# Patient Record
Sex: Female | Born: 1955 | Race: White | Hispanic: No | Smoking: Former smoker
Health system: Southern US, Community
[De-identification: ages and names within clinical notes are randomized; demographics above are authoritative.]

## PROBLEM LIST (undated history)

## (undated) DIAGNOSIS — R32 Unspecified urinary incontinence: Secondary | ICD-10-CM

## (undated) DIAGNOSIS — G4733 Obstructive sleep apnea (adult) (pediatric): Secondary | ICD-10-CM

## (undated) DIAGNOSIS — F32A Depression, unspecified: Secondary | ICD-10-CM

## (undated) DIAGNOSIS — H409 Unspecified glaucoma: Secondary | ICD-10-CM

## (undated) DIAGNOSIS — J45909 Unspecified asthma, uncomplicated: Secondary | ICD-10-CM

## (undated) DIAGNOSIS — M1990 Unspecified osteoarthritis, unspecified site: Secondary | ICD-10-CM

## (undated) DIAGNOSIS — E0590 Thyrotoxicosis, unspecified without thyrotoxic crisis or storm: Secondary | ICD-10-CM

## (undated) DIAGNOSIS — Z8679 Personal history of other diseases of the circulatory system: Secondary | ICD-10-CM

## (undated) DIAGNOSIS — H548 Legal blindness, as defined in USA: Secondary | ICD-10-CM

## (undated) DIAGNOSIS — J449 Chronic obstructive pulmonary disease, unspecified: Secondary | ICD-10-CM

## (undated) DIAGNOSIS — Z8619 Personal history of other infectious and parasitic diseases: Secondary | ICD-10-CM

## (undated) DIAGNOSIS — Z8669 Personal history of other diseases of the nervous system and sense organs: Secondary | ICD-10-CM

## (undated) DIAGNOSIS — K219 Gastro-esophageal reflux disease without esophagitis: Secondary | ICD-10-CM

## (undated) DIAGNOSIS — T7840XA Allergy, unspecified, initial encounter: Secondary | ICD-10-CM

## (undated) DIAGNOSIS — F329 Major depressive disorder, single episode, unspecified: Secondary | ICD-10-CM

## (undated) HISTORY — PX: CARPAL TUNNEL RELEASE: SHX101

## (undated) HISTORY — DX: Legal blindness, as defined in USA: H54.8

## (undated) HISTORY — PX: BREAST REDUCTION SURGERY: SHX8

## (undated) HISTORY — DX: Obstructive sleep apnea (adult) (pediatric): G47.33

## (undated) HISTORY — DX: Depression, unspecified: F32.A

## (undated) HISTORY — DX: Allergy, unspecified, initial encounter: T78.40XA

## (undated) HISTORY — DX: Unspecified osteoarthritis, unspecified site: M19.90

## (undated) HISTORY — DX: Gastro-esophageal reflux disease without esophagitis: K21.9

## (undated) HISTORY — DX: Unspecified glaucoma: H40.9

## (undated) HISTORY — DX: Unspecified urinary incontinence: R32

## (undated) HISTORY — DX: Personal history of other diseases of the circulatory system: Z86.79

## (undated) HISTORY — DX: Unspecified asthma, uncomplicated: J45.909

## (undated) HISTORY — PX: CHOLECYSTECTOMY: SHX55

## (undated) HISTORY — DX: Thyrotoxicosis, unspecified without thyrotoxic crisis or storm: E05.90

## (undated) HISTORY — DX: Personal history of other diseases of the nervous system and sense organs: Z86.69

## (undated) HISTORY — DX: Chronic obstructive pulmonary disease, unspecified: J44.9

## (undated) HISTORY — DX: Major depressive disorder, single episode, unspecified: F32.9

## (undated) HISTORY — DX: Personal history of other infectious and parasitic diseases: Z86.19

---

## 1981-07-09 HISTORY — PX: TUBAL LIGATION: SHX77

## 1998-07-09 HISTORY — PX: ROTATOR CUFF REPAIR: SHX139

## 2009-07-09 HISTORY — PX: BARIATRIC SURGERY: SHX1103

## 2009-12-18 ENCOUNTER — Emergency Department (HOSPITAL_BASED_OUTPATIENT_CLINIC_OR_DEPARTMENT_OTHER): Admission: EM | Admit: 2009-12-18 | Discharge: 2009-12-18 | Payer: Self-pay | Admitting: Emergency Medicine

## 2009-12-18 ENCOUNTER — Ambulatory Visit: Payer: Self-pay | Admitting: Diagnostic Radiology

## 2010-07-09 HISTORY — PX: JOINT REPLACEMENT: SHX530

## 2012-07-23 ENCOUNTER — Ambulatory Visit (INDEPENDENT_AMBULATORY_CARE_PROVIDER_SITE_OTHER): Payer: BC Managed Care – PPO | Admitting: Family

## 2012-07-23 ENCOUNTER — Ambulatory Visit (HOSPITAL_BASED_OUTPATIENT_CLINIC_OR_DEPARTMENT_OTHER)
Admission: RE | Admit: 2012-07-23 | Discharge: 2012-07-23 | Disposition: A | Discharge status: Home / Self Care | Payer: BC Managed Care – PPO | Source: Ambulatory Visit | Attending: Family | Admitting: Family

## 2012-07-23 ENCOUNTER — Encounter: Payer: Self-pay | Admitting: Family

## 2012-07-23 VITALS — BP 124/94 | HR 63 | Temp 97.9°F | Resp 16 | Ht 66.5 in | Wt 211.1 lb

## 2012-07-23 DIAGNOSIS — F418 Other specified anxiety disorders: Secondary | ICD-10-CM

## 2012-07-23 DIAGNOSIS — E049 Nontoxic goiter, unspecified: Secondary | ICD-10-CM

## 2012-07-23 DIAGNOSIS — J4489 Other specified chronic obstructive pulmonary disease: Secondary | ICD-10-CM

## 2012-07-23 DIAGNOSIS — M1990 Unspecified osteoarthritis, unspecified site: Secondary | ICD-10-CM

## 2012-07-23 DIAGNOSIS — Z5181 Encounter for therapeutic drug level monitoring: Secondary | ICD-10-CM

## 2012-07-23 DIAGNOSIS — H409 Unspecified glaucoma: Secondary | ICD-10-CM

## 2012-07-23 DIAGNOSIS — F341 Dysthymic disorder: Secondary | ICD-10-CM

## 2012-07-23 DIAGNOSIS — K219 Gastro-esophageal reflux disease without esophagitis: Secondary | ICD-10-CM | POA: Insufficient documentation

## 2012-07-23 DIAGNOSIS — E042 Nontoxic multinodular goiter: Secondary | ICD-10-CM | POA: Insufficient documentation

## 2012-07-23 DIAGNOSIS — E0590 Thyrotoxicosis, unspecified without thyrotoxic crisis or storm: Secondary | ICD-10-CM

## 2012-07-23 DIAGNOSIS — J449 Chronic obstructive pulmonary disease, unspecified: Secondary | ICD-10-CM | POA: Insufficient documentation

## 2012-07-23 DIAGNOSIS — N393 Stress incontinence (female) (male): Secondary | ICD-10-CM

## 2012-07-23 DIAGNOSIS — J309 Allergic rhinitis, unspecified: Secondary | ICD-10-CM

## 2012-07-23 LAB — BASIC METABOLIC PANEL WITH GFR
Calcium: 9.8 mg/dL (ref 8.4–10.5)
Chloride: 106 mEq/L (ref 96–112)
GFR, Est Non African American: 69 mL/min
Glucose, Bld: 83 mg/dL (ref 70–99)

## 2012-07-23 LAB — HEPATIC FUNCTION PANEL
ALT: <8 U/L (ref 0–35)
AST: 15 U/L (ref 0–37)
Bilirubin, Direct: 0.2 mg/dL (ref 0.0–0.3)
Indirect Bilirubin: 0.8 mg/dL (ref 0.0–0.9)

## 2012-07-23 LAB — T3, FREE: T3, Free: 3.4 pg/mL (ref 2.3–4.2)

## 2012-07-23 MED ORDER — ALBUTEROL SULFATE HFA 108 (90 BASE) MCG/ACT IN AERS: 2.0000 | INHALATION_SPRAY | RESPIRATORY_TRACT | Status: DC | PRN

## 2012-07-23 MED ORDER — VENLAFAXINE HCL ER 75 MG PO CP24: 75.0000 mg | ORAL_CAPSULE | Freq: Every day | ORAL | Status: DC

## 2012-07-23 MED ORDER — BUDESONIDE-FORMOTEROL FUMARATE 160-4.5 MCG/ACT IN AERO: 1.0000 | INHALATION_SPRAY | Freq: Every day | RESPIRATORY_TRACT | Status: DC

## 2012-07-23 MED ORDER — IBUPROFEN 800 MG PO TABS: 800.0000 mg | ORAL_TABLET | Freq: Three times a day (TID) | ORAL | Status: DC | PRN

## 2012-07-23 MED ORDER — METHIMAZOLE 10 MG PO TABS: 10.0000 mg | ORAL_TABLET | Freq: Every day | ORAL | Status: DC

## 2012-07-23 MED ORDER — LAMOTRIGINE 50 MG PO TBDP: 50.0000 mg | ORAL_TABLET | Freq: Two times a day (BID) | ORAL | Status: DC

## 2012-07-23 NOTE — Assessment & Plan Note (Signed)
Not ready to quit smoking. 

## 2012-07-23 NOTE — Assessment & Plan Note (Addendum)
Deteriorated as pt has been unable to afford her effexor.  She is waiting for her flex card.  Plan resume effexor as soon as possible.

## 2012-07-23 NOTE — Assessment & Plan Note (Signed)
Stable off meds since weight loss.  Monitor.

## 2012-07-23 NOTE — Assessment & Plan Note (Signed)
Goiter noted on exam today. On tapazole.  Obtain thyroid US, TFT's.

## 2012-07-23 NOTE — Assessment & Plan Note (Signed)
Continue lumigan eye drops.

## 2012-07-23 NOTE — Assessment & Plan Note (Signed)
Resume flonase.

## 2012-07-23 NOTE — Patient Instructions (Addendum)
  You will be contact about your referral to Pulmonology.  Please let us know if you have not heard back within 1 week about your referral. Please follow up in 1 month for a fasting physical- we will arrange for you. Welcome to [facility]!

## 2012-07-23 NOTE — Progress Notes (Signed)
  Subjective:    Patient ID: Audrey Lewis, female    DOB: [birth year], 56 y.o.   MRN: [id number]  HPI  Ms.  Traxler is a 57 yr old female who presents today to establish care. She has followed with Dr. Debroah Loop in [place].   Asthma/COPD- currently on symbicort and albuterol. Generally well controlled unless she gets sick with "bronchitis." She would like referral to pulmonology.  Depression/anxiety-currently on effexor/xanax and lamictal.  Reports hx of severe depression. Has seen psych and counselor. Has been on antidepressants x >20 yrs.  She has been out of effexor x 2 weeks.  Waiting on flex pay card so she can get her medication.   GERD- Not currently on PPI.  Reports that this has been improved since her bariatric procedure.    Glaucoma/legally blind- using lumigan drops.  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    Seasonal allergies- "lot of sinus issues." seems to be worse in the fall and winter.  Uses otc anti-histamines.  She has flonase at home.  Will restart.    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  Declines referral to ortho at this time due to work restraints.    Obesity- s/p bariatric surgery 2011.  Gastric bypass.  Performed at [facility]- Dr. Adolphus Birchwood. Max weight was 335 pounds.  OSA- has not had CPAP in 2 years.  Has lost >100 pounds. Still snores.    Urinary incontinence- Has been a problem for "years."  Has been to urology- improved since weight loss.  Wears a pad "all the time." + stress incontinence.  Hyperthyroid- maintained on tapazole. She was diagnosed 3 yrs ago.  She was following with her primary care.    Review of Systems  Constitutional: Negative for unexpected weight change.  HENT: Negative for hearing loss.   Eyes: Positive for visual disturbance.  Respiratory: Positive for cough.   Cardiovascular: Negative for leg swelling.  Gastrointestinal: Negative for nausea, vomiting, diarrhea and  constipation.  Genitourinary: Negative for dysuria.  Musculoskeletal: Positive for arthralgias.  Skin: Negative for rash.  Neurological:       Reports rare headaches  Hematological: Negative for adenopathy.  Psychiatric/Behavioral:       See HPI        Objective:   Physical Exam  Constitutional: She is oriented to person, place, and time. She appears well-developed and well-nourished.       Overweight white femal.   HENT:  Head: Normocephalic and atraumatic.  Mouth/Throat: No oropharyngeal exudate.  Eyes: No scleral icterus.  Neck: Thyromegaly present.       Right thyroid lobe is larger than left  Cardiovascular: Normal rate and regular rhythm.   No murmur heard. Pulmonary/Chest: Effort normal and breath sounds normal. No respiratory distress. She has no wheezes. She has no rales. She exhibits no tenderness.  Musculoskeletal: She exhibits no edema.  Neurological: She is alert and oriented to person, place, and time.  Skin:       Hair is thinning  Psychiatric: Her speech is normal and behavior is normal. Judgment and thought content normal. Cognition and memory are normal.       Briefly tearful today- "because I am out of effexor"          Assessment & Plan:

## 2012-07-23 NOTE — Assessment & Plan Note (Signed)
Declines referral to ortho at this time as she does not have enough time off from work.  Would like something to help with pain. Has tried ibuprofen 800 in the past which helped.  Refill provided today.  Advised pt to use sparingly and use tylenol mostly.

## 2012-07-25 ENCOUNTER — Ambulatory Visit (INDEPENDENT_AMBULATORY_CARE_PROVIDER_SITE_OTHER): Payer: BC Managed Care – PPO | Admitting: Family

## 2012-07-25 ENCOUNTER — Encounter: Payer: Self-pay | Admitting: Family

## 2012-07-25 VITALS — BP 114/82 | HR 63 | Temp 97.8°F | Resp 18 | Ht 66.5 in

## 2012-07-25 DIAGNOSIS — R05 Cough: Secondary | ICD-10-CM

## 2012-07-25 DIAGNOSIS — J069 Acute upper respiratory infection, unspecified: Secondary | ICD-10-CM

## 2012-07-25 DIAGNOSIS — R059 Cough, unspecified: Secondary | ICD-10-CM

## 2012-07-25 DIAGNOSIS — J449 Chronic obstructive pulmonary disease, unspecified: Secondary | ICD-10-CM

## 2012-07-25 DIAGNOSIS — J4489 Other specified chronic obstructive pulmonary disease: Secondary | ICD-10-CM

## 2012-07-25 DIAGNOSIS — E042 Nontoxic multinodular goiter: Secondary | ICD-10-CM

## 2012-07-25 MED ORDER — PREDNISONE 10 MG PO TABS: ORAL_TABLET | ORAL | Status: DC

## 2012-07-25 MED ORDER — ALBUTEROL SULFATE (2.5 MG/3ML) 0.083% IN NEBU
2.5000 mg | INHALATION_SOLUTION | Freq: Once | RESPIRATORY_TRACT | Status: AC
  Administered 2012-07-25: 2.5 mg via RESPIRATORY_TRACT

## 2012-07-25 MED ORDER — ALBUTEROL SULFATE (2.5 MG/3ML) 0.083% IN NEBU
2.5000 mg | INHALATION_SOLUTION | Freq: Four times a day (QID) | RESPIRATORY_TRACT | Status: AC | PRN
Start: 2012-07-25 — End: ?

## 2012-07-25 NOTE — Patient Instructions (Addendum)
Please start prednisone, use albuterol every 6 hours. You may use delsym as needed for cough. You will be contact about your referral to endocrinology.  Please let us know if you have not heard back within 1 week about your referral. Call if symptoms worsen, or if not improved by Monday.

## 2012-07-25 NOTE — Assessment & Plan Note (Signed)
Suspect early COPD exacerbation.  Will rx pred taper along with albuterol nebs- rx provided for nebulizer with tubing.

## 2012-07-25 NOTE — Progress Notes (Signed)
Subjective:    Patient ID: Audrey Lewis, female    DOB: 08/06/1955, 57 y.o.   MRN: [id number]  HPI  Pt reports cough and post nasal drip since yesterday. Has headache and throat feels raw. Pt reports completing 2 round antibiotics and medrol dose packs since 06/24/13 for bronchitis. Pt reports that she has a Proofreader" who she saw yesterday due to cough and chest tightness.  Pt was told that she was having trouble moving some air and was given nebulizer.   She reports brief improvement after nebulizer.    Goiter-  [place] notes multinodular goiter.  TSH was elevated.  Pt is on tapazole.   Review of Systems See HPI  Past Medical History  Diagnosis Date  . Arthritis   . Asthma   . Depression   . History of chicken pox   . COPD (chronic obstructive pulmonary disease)   . GERD (gastroesophageal reflux disease)   . Glaucoma   . Allergy   . Urinary incontinence   . History of high blood pressure   . Thyroid disease     overactive  . H/O sleep apnea   . Legally blind     History   Social History  . Marital Status: Divorced    Spouse Name: N/A    Number of Children: 1  . Years of Education: N/A   Occupational History  .  [employer]   Social History Main Topics  . Smoking status: Current Every Day Smoker -- 2.0 packs/day    Types: Cigarettes  . Smokeless tobacco: Never Used  . Alcohol Use: No  . Drug Use: Not on file  . Sexually Active: Not on file   Other Topics Concern  . Not on file   Social History Narrative   Works as a Producer, television/film/video is single- lives aloneDaughter lives in [place]Completed 12th gradeEnjoys bowling, blind/visually impaired friends, gambling- internet slots.      Past Surgical History  Procedure Date  . Carpal tunnel release     bilateral  . Rotator cuff repair 2000    right shoulder  . Bariatric surgery 2011  . Joint replacement 2012    left knee  . Tubal ligation 1983    Family History  Problem Relation Age of Onset    . Arthritis Mother   . Hypertension Mother   . Arthritis Father   . Cancer Father     prostate  . Hyperlipidemia Father   . Heart disease Father   . Diabetes Maternal Aunt   . Diabetes Maternal Grandmother   . Hypertension Maternal Grandmother   . Heart disease Maternal Grandfather   . Heart disease Paternal Grandfather     Allergies  Allergen Reactions  . Shellfish Allergy Shortness Of Breath and Swelling    Current Outpatient Prescriptions on File Prior to Visit  Medication Sig Dispense Refill  . albuterol (PROAIR HFA) 108 (90 BASE) MCG/ACT inhaler Inhale 2 puffs into the lungs every 4 (four) hours as needed.  3 Inhaler  0  . ALPRAZolam (XANAX) 0.5 MG tablet Take 0.5 mg by mouth 2 (two) times daily as needed.      . bimatoprost (LUMIGAN) 0.01 % SOLN Place 1 drop into both eyes every evening.      . budesonide-formoterol (SYMBICORT) 160-4.5 MCG/ACT inhaler Inhale 1 puff into the lungs daily.  3 Inhaler  0  . ibuprofen (ADVIL,MOTRIN) 800 MG tablet Take 1 tablet (800 mg total) by mouth every 8 (eight) hours as  needed for pain.  30 tablet  0  . LamoTRIgine 50 MG TBDP Take 1 tablet (50 mg total) by mouth 2 (two) times daily.  180 tablet  0  . Multiple Vitamins-Minerals (ONE DAILY WOMENS PO) Take 1 tablet by mouth daily.      . NON FORMULARY ColonClenz.  Take 2 capsules twice a day.      . venlafaxine XR (EFFEXOR-XR) 75 MG 24 hr capsule Take 1 capsule (75 mg total) by mouth daily.  90 capsule  0    BP 114/82  Pulse 63  Temp 97.8 F (36.6 C) (Oral)  Resp 18  Ht 5' 6.5" (1.689 m)  SpO2 97%  LMP 07/09/2008       Objective:   Physical Exam  Constitutional: She appears well-developed and well-nourished. No distress.  HENT:  Head: Normocephalic and atraumatic.  Right Ear: Tympanic membrane and ear canal normal.  Left Ear: Tympanic membrane and ear canal normal.  Mouth/Throat: No oropharyngeal exudate, posterior oropharyngeal edema or posterior oropharyngeal erythema.   Cardiovascular: Normal rate and regular rhythm.   No murmur heard. Pulmonary/Chest: Effort normal. She has no wheezes. She has no rales.       Diminished breath sounds throughout.  Psychiatric: She has a normal mood and affect. Her speech is normal and behavior is normal. Judgment and thought content normal. Cognition and memory are normal.          Assessment & Plan:  Rapid flu negative.

## 2012-07-25 NOTE — Assessment & Plan Note (Signed)
Recommended that pt call us if symptoms worsen or if not improved by Monday.

## 2012-07-31 ENCOUNTER — Ambulatory Visit: Payer: BC Managed Care – PPO | Admitting: Internal Medicine

## 2012-08-04 ENCOUNTER — Telehealth: Payer: Self-pay | Admitting: Family

## 2012-08-04 NOTE — Telephone Encounter (Signed)
Received medical records from [facility]. [place]   P: [phone] F: [phone]

## 2012-08-04 NOTE — Telephone Encounter (Signed)
Received medical records from [facility]. [place].  P: [phone] F: [phone]

## 2012-08-18 ENCOUNTER — Other Ambulatory Visit (HOSPITAL_COMMUNITY)
Admission: RE | Admit: 2012-08-18 | Discharge: 2012-08-18 | Disposition: A | Discharge status: Home / Self Care | Payer: BC Managed Care – PPO | Source: Ambulatory Visit | Attending: Family | Admitting: Family

## 2012-08-18 ENCOUNTER — Encounter: Payer: Self-pay | Admitting: Family

## 2012-08-18 ENCOUNTER — Ambulatory Visit: Payer: BC Managed Care – PPO | Admitting: Internal Medicine

## 2012-08-18 ENCOUNTER — Ambulatory Visit (INDEPENDENT_AMBULATORY_CARE_PROVIDER_SITE_OTHER): Payer: BC Managed Care – PPO | Admitting: Family

## 2012-08-18 ENCOUNTER — Encounter: Payer: Self-pay | Admitting: Critical Care Medicine

## 2012-08-18 ENCOUNTER — Ambulatory Visit (HOSPITAL_BASED_OUTPATIENT_CLINIC_OR_DEPARTMENT_OTHER)
Admission: RE | Admit: 2012-08-18 | Discharge: 2012-08-18 | Disposition: A | Discharge status: Home / Self Care | Payer: BC Managed Care – PPO | Source: Ambulatory Visit | Attending: Critical Care Medicine | Admitting: Critical Care Medicine

## 2012-08-18 ENCOUNTER — Ambulatory Visit (INDEPENDENT_AMBULATORY_CARE_PROVIDER_SITE_OTHER): Payer: BC Managed Care – PPO | Admitting: Critical Care Medicine

## 2012-08-18 ENCOUNTER — Telehealth: Payer: Self-pay | Admitting: Family

## 2012-08-18 VITALS — BP 146/90 | HR 67 | Temp 97.7°F | Resp 16 | Ht 66.5 in | Wt 211.0 lb

## 2012-08-18 VITALS — BP 146/96 | HR 60 | Temp 98.2°F | Ht 66.0 in | Wt 211.0 lb

## 2012-08-18 DIAGNOSIS — F17200 Nicotine dependence, unspecified, uncomplicated: Secondary | ICD-10-CM | POA: Insufficient documentation

## 2012-08-18 DIAGNOSIS — R059 Cough, unspecified: Secondary | ICD-10-CM | POA: Insufficient documentation

## 2012-08-18 DIAGNOSIS — Z01419 Encounter for gynecological examination (general) (routine) without abnormal findings: Secondary | ICD-10-CM | POA: Insufficient documentation

## 2012-08-18 DIAGNOSIS — J449 Chronic obstructive pulmonary disease, unspecified: Secondary | ICD-10-CM

## 2012-08-18 DIAGNOSIS — J189 Pneumonia, unspecified organism: Secondary | ICD-10-CM | POA: Insufficient documentation

## 2012-08-18 DIAGNOSIS — J441 Chronic obstructive pulmonary disease with (acute) exacerbation: Secondary | ICD-10-CM | POA: Insufficient documentation

## 2012-08-18 DIAGNOSIS — G4733 Obstructive sleep apnea (adult) (pediatric): Secondary | ICD-10-CM

## 2012-08-18 DIAGNOSIS — F418 Other specified anxiety disorders: Secondary | ICD-10-CM

## 2012-08-18 DIAGNOSIS — Z0000 Encounter for general adult medical examination without abnormal findings: Secondary | ICD-10-CM

## 2012-08-18 DIAGNOSIS — R05 Cough: Secondary | ICD-10-CM | POA: Insufficient documentation

## 2012-08-18 DIAGNOSIS — F341 Dysthymic disorder: Secondary | ICD-10-CM

## 2012-08-18 HISTORY — DX: Obstructive sleep apnea (adult) (pediatric): G47.33

## 2012-08-18 MED ORDER — BUDESONIDE-FORMOTEROL FUMARATE 160-4.5 MCG/ACT IN AERO: 2.0000 | INHALATION_SPRAY | Freq: Two times a day (BID) | RESPIRATORY_TRACT | Status: DC

## 2012-08-18 MED ORDER — FLUTICASONE PROPIONATE 50 MCG/ACT NA SUSP: 2.0000 | Freq: Every day | NASAL | Status: DC

## 2012-08-18 MED ORDER — ALBUTEROL SULFATE HFA 108 (90 BASE) MCG/ACT IN AERS: 2.0000 | INHALATION_SPRAY | RESPIRATORY_TRACT | Status: DC | PRN

## 2012-08-18 MED ORDER — AZITHROMYCIN 250 MG PO TABS: 250.0000 mg | ORAL_TABLET | Freq: Every day | ORAL | Status: DC

## 2012-08-18 MED ORDER — PREDNISONE 10 MG PO TABS: ORAL_TABLET | ORAL | Status: DC

## 2012-08-18 MED ORDER — AMOXICILLIN-POT CLAVULANATE 875-125 MG PO TABS: 1.0000 | ORAL_TABLET | Freq: Two times a day (BID) | ORAL | Status: DC

## 2012-08-18 NOTE — Assessment & Plan Note (Signed)
COPD not yet staged as there are no pulmonary function studies yet available Active smoking Prior patient of [facility] Ongoing airway inflammation with ongoing smoking use Plan The patient was given 3-10 minutes of smoking cessation counseling and encouraged to resume nicotine replacement therapy Stay on symbicort two puff twice daily>>refill sent to [facility] Proair as needed or nebulizer as needed Take augmentin twice a day for 7days Take flonase two puff each nostril daily until cannister empty Take prednisone 10mg  Take 4 for two days three for two days two for two days one for two days Overnight sleep oximetry  Pulmonary function studies Chest xray today Return 2 months

## 2012-08-18 NOTE — Progress Notes (Addendum)
Subjective:    Patient ID: Audrey Lewis, female    DOB: [birth year], 56 y.o.   MRN: [id number]  HPI Comments: Dx Copd for several years.  On no oxygen.  Pt still smokes 2PPD Previous Deniece Portela [place] last seen 6months  Shortness of Breath This is a chronic problem. The current episode started more than 1 year ago. Episode frequency: Exertional only: climbing hills, walking long distance, occ QHS dypsnea from sleep. The problem has been unchanged (just got over bronchitis spell). Associated symptoms include ear pain, headaches, PND, rhinorrhea, sputum production and wheezing. Pertinent negatives include no abdominal pain, chest pain, claudication, coryza, fever, hemoptysis, leg pain, leg swelling, neck pain, orthopnea, rash, sore throat, swollen glands, syncope or vomiting. The symptoms are aggravated by occupational exposure, smoke, URIs, weather changes, fumes, eating, lying flat, any activity and exercise (works in Designer, fashion/clothing). Associated symptoms comments: Mucus daily, not clear. Notes nasal sores R sided headaches and ear pain. Risk factors include smoking. She has tried beta agonist inhalers and steroid inhalers for the symptoms. The treatment provided moderate relief. Her past medical history is significant for asthma and COPD. There is no history of allergies, aspirin allergies, bronchiolitis, CAD, chronic lung disease, DVT, a heart failure, PE, pneumonia or a recent surgery.    CAT Score 08/18/2012  Total CAT Score 23   Past Medical History  Diagnosis Date  . Arthritis   . Asthma   . Depression   . History of chicken pox   . COPD (chronic obstructive pulmonary disease)   . GERD (gastroesophageal reflux disease)   . Glaucoma   . Allergy   . Urinary incontinence   . History of high blood pressure   . Thyroid disease     overactive  . H/O sleep apnea   . Legally blind   . OSA (obstructive sleep apnea) 08/18/2012     Family History  Problem Relation Age of Onset  . Arthritis Mother    . Hypertension Mother   . Arthritis Father   . Cancer Father     prostate  . Hyperlipidemia Father   . Heart disease Father   . Diabetes Maternal Aunt   . Diabetes Maternal Grandmother   . Hypertension Maternal Grandmother   . Heart disease Maternal Grandfather   . Heart disease Paternal Grandfather      History   Social History  . Marital Status: Divorced    Spouse Name: N/A    Number of Children: 1  . Years of Education: N/A   Occupational History  .  [employer]   Social History Main Topics  . Smoking status: Current Every Day Smoker -- 2.00 packs/day    Types: Cigarettes  . Smokeless tobacco: Never Used     Comment: Started smoking at age 17.    Marland Kitchen Alcohol Use: No  . Drug Use: No  . Sexually Active: Not on file   Other Topics Concern  . Not on file   Social History Narrative   Works as a Designer, industrial/product   Pt is single- lives alone   Daughter lives in [place]   Completed 12th grade   Enjoys bowling, blind/visually impaired friends, gambling- internet slots.       Allergies  Allergen Reactions  . Shellfish Allergy Shortness Of Breath and Swelling     Outpatient Prescriptions Prior to Visit  Medication Sig Dispense Refill  . albuterol (PROVENTIL) (2.5 MG/3ML) 0.083% nebulizer solution Take 3 mLs (2.5 mg total) by nebulization every 6 (  six) hours as needed for wheezing.  150 mL  1  . ALPRAZolam (XANAX) 0.5 MG tablet Take 0.5 mg by mouth 2 (two) times daily as needed.      . bimatoprost (LUMIGAN) 0.01 % SOLN Place 1 drop into both eyes every evening.      Marland Kitchen ibuprofen (ADVIL,MOTRIN) 800 MG tablet Take 1 tablet (800 mg total) by mouth every 8 (eight) hours as needed for pain.  30 tablet  0  . LamoTRIgine 50 MG TBDP Take 1 tablet (50 mg total) by mouth 2 (two) times daily.  180 tablet  0  . Multiple Vitamins-Minerals (ONE DAILY WOMENS PO) Take 1 tablet by mouth daily.      . NON FORMULARY ColonClenz.  Take 2 capsules twice a day.      . albuterol  (PROAIR HFA) 108 (90 BASE) MCG/ACT inhaler Inhale 2 puffs into the lungs every 4 (four) hours as needed.  3 Inhaler  0  . budesonide-formoterol (SYMBICORT) 160-4.5 MCG/ACT inhaler Inhale 1 puff into the lungs daily.  3 Inhaler  0  . venlafaxine XR (EFFEXOR-XR) 75 MG 24 hr capsule Take 1 capsule (75 mg total) by mouth daily.  90 capsule  0  . predniSONE (DELTASONE) 10 MG tablet 4 tabs by mouth once daily for 2 days, then 3 tabs once daily x 2 days, then 2 tabs once daily x 2 days, then 1 tablet once daily for 2 days then stop.  20 tablet  0   No facility-administered medications prior to visit.     Review of Systems  Constitutional: Positive for diaphoresis. Negative for fever, chills, activity change, appetite change, fatigue and unexpected weight change.  HENT: Positive for ear pain, congestion, rhinorrhea and sinus pressure. Negative for hearing loss, nosebleeds, sore throat, facial swelling, sneezing, mouth sores, trouble swallowing, neck pain, neck stiffness, dental problem, voice change, postnasal drip, tinnitus and ear discharge.   Eyes: Negative for photophobia, discharge, itching and visual disturbance.  Respiratory: Positive for cough, sputum production, shortness of breath and wheezing. Negative for apnea, hemoptysis, choking, chest tightness and stridor.   Cardiovascular: Positive for PND. Negative for chest pain, palpitations, orthopnea, claudication, leg swelling and syncope.  Gastrointestinal: Negative for nausea, vomiting, abdominal pain, constipation, blood in stool and abdominal distention.       NO GERD  Genitourinary: Negative for dysuria, urgency, frequency, hematuria, flank pain, decreased urine volume and difficulty urinating.  Musculoskeletal: Positive for arthralgias. Negative for myalgias, back pain, joint swelling and gait problem.  Skin: Negative for color change, pallor and rash.  Neurological: Positive for tremors and headaches. Negative for dizziness, seizures,  syncope, speech difficulty, weakness, light-headedness and numbness.  Hematological: Negative for adenopathy. Does not bruise/bleed easily.  Psychiatric/Behavioral: Negative for confusion, sleep disturbance and agitation. The patient is not nervous/anxious.        Objective:   Physical Exam  Filed Vitals:   08/18/12 0944  BP: 146/96  Pulse: 60  Temp: 98.2 F (36.8 C)  TempSrc: Oral  Height: 5\' 6"  (1.676 m)  Weight: 95.709 kg (211 lb)  SpO2: 96%    Gen: Pleasant, well-nourished, in no distress,  normal affect  ENT: No lesions,  mouth clear,  oropharynx clear, no postnasal drip  Neck: No JVD, no TMG, no carotid bruits  Lungs: No use of accessory muscles, no dullness to percussion,Distant breath sounds with expired wheezes  Cardiovascular: RRR, heart sounds normal, no murmur or gallops, no peripheral edema  Abdomen: soft and NT, no HSM,  BS normal  Musculoskeletal: No deformities, no cyanosis or clubbing  Neuro: alert, non focal  Skin: Warm, no lesions or rashes  Dg Chest 2 View  08/18/2012  *RADIOLOGY REPORT*  Clinical Data: Cough, COPD exacerbation  CHEST - 2 VIEW  Comparison: None.  Findings: Mild patchy right upper lobe opacities, suspicious for pneumonia. No pleural effusion or pneumothorax.  Cardiomediastinal silhouette is within normal limits.  Mild degenerative changes of the visualized thoracolumbar spine.  IMPRESSION: Mild patchy right upper lobe opacities, suspicious for pneumonia.   Original Report Authenticated By: Charline Bills, M.D.          Assessment & Plan:   COPD (chronic obstructive pulmonary disease) COPD not yet staged as there are no pulmonary function studies yet available Active smoking Prior patient of [facility] Ongoing airway inflammation with ongoing smoking use Plan The patient was given 3-10 minutes of smoking cessation counseling and encouraged to resume nicotine replacement therapy Stay on symbicort two puff twice  daily>>refill sent to [facility] Proair as needed or nebulizer as needed Take augmentin twice a day for 7days Take flonase two puff each nostril daily until cannister empty Take prednisone 10mg  Take 4 for two days three for two days two for two days one for two days Overnight sleep oximetry  Pulmonary function studies Chest xray today Return 2 months   Tobacco use disorder Ongoing tobacco use and the patient was counseled as to smoking cessation and use of nicotine replacement therapy   CAP (community acquired pneumonia) Early RUL Cap Plan Add azithromycin to augmentin    Updated Medication List Outpatient Encounter Prescriptions as of 08/18/2012  Medication Sig Dispense Refill  . albuterol (PROAIR HFA) 108 (90 BASE) MCG/ACT inhaler Inhale 2 puffs into the lungs every 4 (four) hours as needed.  3 Inhaler  4  . albuterol (PROVENTIL) (2.5 MG/3ML) 0.083% nebulizer solution Take 3 mLs (2.5 mg total) by nebulization every 6 (six) hours as needed for wheezing.  150 mL  1  . ALPRAZolam (XANAX) 0.5 MG tablet Take 0.5 mg by mouth 2 (two) times daily as needed.      . bimatoprost (LUMIGAN) 0.01 % SOLN Place 1 drop into both eyes every evening.      . budesonide-formoterol (SYMBICORT) 160-4.5 MCG/ACT inhaler Inhale 2 puffs into the lungs 2 (two) times daily.  3 Inhaler  4  . ibuprofen (ADVIL,MOTRIN) 800 MG tablet Take 1 tablet (800 mg total) by mouth every 8 (eight) hours as needed for pain.  30 tablet  0  . LamoTRIgine 50 MG TBDP Take 1 tablet (50 mg total) by mouth 2 (two) times daily.  180 tablet  0  . Multiple Vitamins-Minerals (ONE DAILY WOMENS PO) Take 1 tablet by mouth daily.      . NON FORMULARY ColonClenz.  Take 2 capsules twice a day.      . [DISCONTINUED] albuterol (PROAIR HFA) 108 (90 BASE) MCG/ACT inhaler Inhale 2 puffs into the lungs every 4 (four) hours as needed.  3 Inhaler  0  . [DISCONTINUED] budesonide-formoterol (SYMBICORT) 160-4.5 MCG/ACT inhaler Inhale 1 puff into the lungs  daily.  3 Inhaler  0  . [DISCONTINUED] budesonide-formoterol (SYMBICORT) 160-4.5 MCG/ACT inhaler Inhale 2 puffs into the lungs 2 (two) times daily.      . [DISCONTINUED] venlafaxine XR (EFFEXOR-XR) 75 MG 24 hr capsule Take 1 capsule (75 mg total) by mouth daily.  90 capsule  0  . amoxicillin-clavulanate (AUGMENTIN) 875-125 MG per tablet Take 1 tablet by mouth 2 (  two) times daily.  14 tablet  0  . azithromycin (ZITHROMAX) 250 MG tablet Take 1 tablet (250 mg total) by mouth daily. Take two once then one daily until gone  6 each  0  . fluticasone (FLONASE) 50 MCG/ACT nasal spray Place 2 sprays into the nose daily.  16 g  0  . predniSONE (DELTASONE) 10 MG tablet Take 4 for two days three for two days two for two days one for two days  20 tablet  0  . [DISCONTINUED] predniSONE (DELTASONE) 10 MG tablet 4 tabs by mouth once daily for 2 days, then 3 tabs once daily x 2 days, then 2 tabs once daily x 2 days, then 1 tablet once daily for 2 days then stop.  20 tablet  0   No facility-administered encounter medications on file as of 08/18/2012.

## 2012-08-18 NOTE — Patient Instructions (Addendum)
Stay on symbicort two puff twice daily>>refill sent to [facility] Proair as needed or nebulizer as needed Take augmentin twice a day for 7days Take flonase two puff each nostril daily until cannister empty Take prednisone 10mg  Take 4 for two days three for two days two for two days one for two days All above sent to downstairs pharmacy Overnight sleep oximetry in your home Pulmonary function studies Chest xray today Return 2 months

## 2012-08-18 NOTE — Assessment & Plan Note (Signed)
Pt counseled on healthy diet, exercise, weight loss.  Obtain fasting lab work, form filled for summer camp.  Declines colo, agreeable to IFOB.  EKG today.  Pap performed today.

## 2012-08-18 NOTE — Patient Instructions (Addendum)
Please complete your stool kit and return. Complete fasting lab work prior to leaving. Follow up in 2 months, sooner if problems or concerns.

## 2012-08-18 NOTE — Addendum Note (Signed)
Addended by: Storm Frisk on: 08/18/2012 04:36 PM   Modules accepted: Orders

## 2012-08-18 NOTE — Addendum Note (Signed)
Addended by: Storm Frisk on: 08/18/2012 04:37 PM   Modules accepted: Orders

## 2012-08-18 NOTE — Progress Notes (Signed)
Quick Note:  Notify the patient that the Xray shows early pneumonia in her R upper lung. I have called in Azithromycin that she is to take along with the augmentin. Continue other meds as prescribed at last office visit ______

## 2012-08-18 NOTE — Assessment & Plan Note (Signed)
Ongoing tobacco use and the patient was counseled as to smoking cessation and use of nicotine replacement therapy

## 2012-08-18 NOTE — Progress Notes (Signed)
Subjective:    Patient ID: Audrey Lewis, female    DOB: May 04, 1956, 57 y.o.   MRN: [id number]  HPI  [name] is a 57 yr old female who presents today for cpx.  Immunizations: She thinks last tetanus <5 yrs ago.  Flu shot up to date.  Diet: Reports that she "craves sweets."   Exercise: Not exercising Colonoscopy: Declines at this time. Dexa: This was done several years Pap Smear: years ago. Mammogram: scheduled through employer for next week.   Depression/anxiety-  She reports that she has been on lamictal for 6 months.  Still feels "blah."  Reports that she has tried prozac, wellbutrin, effexor, cymbalta, lexapro, zoloft.  She reports that symptom improved when she was placed on lamictal.  She continues effexor.     Pt saw pulmonary today and was told that she has sinusitis and OM- given rx for Augmentin and prednisone.   Review of Systems  Constitutional: Negative for unexpected weight change.  HENT: Positive for congestion.   Eyes: Positive for visual disturbance.  Respiratory: Negative for cough.   Cardiovascular: Negative for chest pain.  Gastrointestinal: Negative for abdominal pain and blood in stool.  Genitourinary: Negative for dysuria and frequency.  Musculoskeletal: Negative for back pain.  Skin: Negative for rash.  Neurological: Negative for headaches.  Hematological: Negative for adenopathy.  Psychiatric/Behavioral:       See HPI   Past Medical History  Diagnosis Date  . Arthritis   . Asthma   . Depression   . History of chicken pox   . COPD (chronic obstructive pulmonary disease)   . GERD (gastroesophageal reflux disease)   . Glaucoma   . Allergy   . Urinary incontinence   . History of high blood pressure   . Thyroid disease     overactive  . H/O sleep apnea   . Legally blind     History   Social History  . Marital Status: Divorced    Spouse Name: N/A    Number of Children: 1  . Years of Education: N/A   Occupational History  .  Industries [employer]   Social History Main Topics  . Smoking status: Current Every Day Smoker -- 2.00 packs/day    Types: Cigarettes  . Smokeless tobacco: Never Used     Comment: Started smoking at age 15.    Marland Kitchen Alcohol Use: No  . Drug Use: No  . Sexually Active: Not on file   Other Topics Concern  . Not on file   Social History Narrative   Works as a Designer, industrial/product   Pt is single- lives alone   Daughter lives in [place]   Completed 12th grade   Enjoys bowling, blind/visually impaired friends, gambling- internet slots.      Past Surgical History  Procedure Laterality Date  . Carpal tunnel release      bilateral  . Rotator cuff repair  2000    right shoulder  . Bariatric surgery  2011  . Joint replacement  2012    left knee  . Tubal ligation  1983  . Breast reduction surgery    . Cholecystectomy      Family History  Problem Relation Age of Onset  . Arthritis Mother   . Hypertension Mother   . Arthritis Father   . Cancer Father     prostate  . Hyperlipidemia Father   . Heart disease Father   . Diabetes Maternal Aunt   . Diabetes Maternal Grandmother   .  Hypertension Maternal Grandmother   . Heart disease Maternal Grandfather   . Heart disease Paternal Grandfather     Allergies  Allergen Reactions  . Shellfish Allergy Shortness Of Breath and Swelling    Current Outpatient Prescriptions on File Prior to Visit  Medication Sig Dispense Refill  . albuterol (PROAIR HFA) 108 (90 BASE) MCG/ACT inhaler Inhale 2 puffs into the lungs every 4 (four) hours as needed.  3 Inhaler  4  . albuterol (PROVENTIL) (2.5 MG/3ML) 0.083% nebulizer solution Take 3 mLs (2.5 mg total) by nebulization every 6 (six) hours as needed for wheezing.  150 mL  1  . ALPRAZolam (XANAX) 0.5 MG tablet Take 0.5 mg by mouth 2 (two) times daily as needed.      Marland Kitchen amoxicillin-clavulanate (AUGMENTIN) 875-125 MG per tablet Take 1 tablet by mouth 2 (two) times daily.  14 tablet  0  . bimatoprost (LUMIGAN) 0.01 %  SOLN Place 1 drop into both eyes every evening.      . budesonide-formoterol (SYMBICORT) 160-4.5 MCG/ACT inhaler Inhale 2 puffs into the lungs 2 (two) times daily.  3 Inhaler  4  . fluticasone (FLONASE) 50 MCG/ACT nasal spray Place 2 sprays into the nose daily.  16 g  0  . ibuprofen (ADVIL,MOTRIN) 800 MG tablet Take 1 tablet (800 mg total) by mouth every 8 (eight) hours as needed for pain.  30 tablet  0  . LamoTRIgine 50 MG TBDP Take 1 tablet (50 mg total) by mouth 2 (two) times daily.  180 tablet  0  . Multiple Vitamins-Minerals (ONE DAILY WOMENS PO) Take 1 tablet by mouth daily.      . NON FORMULARY ColonClenz.  Take 2 capsules twice a day.      . predniSONE (DELTASONE) 10 MG tablet Take 4 for two days three for two days two for two days one for two days  20 tablet  0  . venlafaxine XR (EFFEXOR-XR) 75 MG 24 hr capsule Take 1 capsule (75 mg total) by mouth daily.  90 capsule  0   No current facility-administered medications on file prior to visit.    BP 146/90  Pulse 67  Temp(Src) 97.7 F (36.5 C) (Oral)  Resp 16  Ht 5' 6.5" (1.689 m)  Wt 211 lb (95.709 kg)  BMI 33.55 kg/m2  SpO2 96%  LMP 07/09/2008       Objective:   Physical Exam  Physical Exam  Constitutional: She is oriented to person, place, and time. She appears well-developed and well-nourished. No distress.  HENT:  Head: Normocephalic and atraumatic.  Right Ear: Tympanic membrane mildly pink in color, no bulging and ear canal normal.  Left Ear: Tympanic membrane mildly pink in color no bulging and ear canal normal.  Mouth/Throat: Oropharynx is clear and moist.  Eyes: Pupils are equal, round, and reactive to light. No scleral icterus.  Neck: Normal range of motion. No thyromegaly present.  Cardiovascular: Normal rate and regular rhythm.   No murmur heard. Pulmonary/Chest: Effort normal and breath sounds normal. No respiratory distress. He has no wheezes. She has no rales. She exhibits no tenderness.  Abdominal: Soft.  Bowel sounds are normal. He exhibits no distension and no mass. There is no tenderness. There is no rebound and no guarding.  Musculoskeletal: She exhibits no edema.  Lymphadenopathy:    She has no cervical adenopathy.  Neurological: She is alert and oriented to person, place, and time. She exhibits normal muscle tone. Coordination normal.  Skin: Skin is warm and  dry.  Psychiatric: She has a normal mood and affect. Her behavior is normal. Judgment and thought content normal.  Breasts: Examined lying- some scarring noted from breast reduction surgery Right: Without masses, retractions, discharge or axillary adenopathy.  Left: Without masses, retractions, discharge or axillary adenopathy.  Inguinal/mons: Normal without inguinal adenopathy  External genitalia: Normal  BUS/Urethra/Skene's glands: Normal  Bladder: Normal  Vagina: Normal - atrophied Cervix: Normal  Uterus: normal in size, shape and contour. Midline and mobile  Adnexa/parametria:  Rt: Without masses or tenderness.  Lt: Without masses or tenderness.  Anus and perineum: Normal           Assessment & Plan:          Assessment & Plan:

## 2012-08-18 NOTE — Assessment & Plan Note (Signed)
Depression not optimally controlled. Will increase effexor from 75mg  to 150 mg.  Follow up in 2 months, sooner if problems/concerns.

## 2012-08-18 NOTE — Assessment & Plan Note (Signed)
Early RUL Cap Plan Add azithromycin to augmentin

## 2012-08-18 NOTE — Progress Notes (Signed)
Quick Note:  Called, spoke with pt. Informed her of cxr results and recs per Dr. Delford Field. She is aware azithromycin rx was sent to [facility] by PW and she is to take this along with the augmenting rx. Advised to cont with other meds and recs per OV instructions from OV with PW am. She verbalized understanding of these results and recs and voiced no further questions or concerns at this time. ______

## 2012-08-18 NOTE — Telephone Encounter (Signed)
Could you pls call pt and clarify with her if she has an aspirin or ibuprofen allergy and if so what her reaction is?  Old records say allergy but I see ibuprofen on her med list?

## 2012-08-19 ENCOUNTER — Encounter: Payer: Self-pay | Admitting: Family

## 2012-08-19 LAB — URINALYSIS, ROUTINE W REFLEX MICROSCOPIC
Bilirubin Urine: NEGATIVE
Glucose, UA: NEGATIVE mg/dL
Hgb urine dipstick: NEGATIVE
Protein, ur: NEGATIVE mg/dL
Specific Gravity, Urine: 1.017 (ref 1.005–1.030)
pH: 5.5 (ref 5.0–8.0)

## 2012-08-19 LAB — CBC WITH DIFFERENTIAL/PLATELET
Basophils Absolute: 0 10*3/uL (ref 0.0–0.1)
Basophils Relative: 0 % (ref 0–1)
HCT: 40.2 % (ref 36.0–46.0)
Hemoglobin: 14.5 g/dL (ref 12.0–15.0)
Lymphocytes Relative: 32 % (ref 12–46)
MCH: 33.2 pg (ref 26.0–34.0)
MCHC: 36.1 g/dL — ABNORMAL HIGH (ref 30.0–36.0)
Neutro Abs: 4.6 10*3/uL (ref 1.7–7.7)
Platelets: 306 10*3/uL (ref 150–400)
WBC: 7.5 10*3/uL (ref 4.0–10.5)

## 2012-08-19 LAB — LIPID PANEL
Cholesterol: 196 mg/dL (ref 0–200)
HDL: 79 mg/dL (ref >39)
Total CHOL/HDL Ratio: 2.5 Ratio
VLDL: 9 mg/dL (ref 0–40)

## 2012-08-19 NOTE — Telephone Encounter (Signed)
Spoke with pt and she states that before she had bariatric surgery Ibuprofen would cause indigestion but has not had any problems taking ibuprofen since the surgery and currently takes it. Pt states she does not know if she has an allergy to ASA as she does not take it and can't remember taking it in the past.

## 2012-08-20 ENCOUNTER — Encounter: Payer: Self-pay | Admitting: Family

## 2012-08-25 ENCOUNTER — Encounter: Payer: Self-pay | Admitting: Internal Medicine

## 2012-08-25 ENCOUNTER — Ambulatory Visit (INDEPENDENT_AMBULATORY_CARE_PROVIDER_SITE_OTHER): Payer: BC Managed Care – PPO | Admitting: Internal Medicine

## 2012-08-25 VITALS — BP 140/80 | HR 83 | Temp 98.1°F | Resp 16 | Ht 66.0 in | Wt 214.0 lb

## 2012-08-25 DIAGNOSIS — E0590 Thyrotoxicosis, unspecified without thyrotoxic crisis or storm: Secondary | ICD-10-CM

## 2012-08-25 LAB — T4, FREE: Free T4: 0.94 ng/dL (ref 0.60–1.60)

## 2012-08-25 LAB — T3, FREE: T3, Free: 3.1 pg/mL (ref 2.3–4.2)

## 2012-08-25 LAB — TSH: TSH: 0.2 u[IU]/mL — ABNORMAL LOW (ref 0.35–5.50)

## 2012-08-25 NOTE — Patient Instructions (Addendum)
We will call you with the scheduling of your thyroid uptake and scan. Please call us if you do not hear from [place] in 5 days. We will call you with the results of your blood work (or email you) - [email or web address]

## 2012-08-25 NOTE — Progress Notes (Addendum)
Subjective:     Patient ID: Audrey Lewis, female   DOB: January 15, 1956, 57 y.o.   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  Pt was dx with hyperthyroidism in 02/2010 at Dr. Drucie Opitz. She was started on MMI and tests were normal on 1 tabs of 10 mg twice a day >> decreased to 10 mg a day 8 months ago. She then changed her PCP and now sees Audrey Lewis.  Patient's last TSH (08/18/2012) was 0.726, previously, 5.43 to (07/23/2012) with a free T3 of 3.4 (2.2-4.2) and a free T4 of 0.81 (0.8-1.8). At that time she was on Tapazole, but she was taken off Tapazole  the same day. A CBC with differential was normal on 07/23/2012. Patient had a thyroid ultrasound (07/24/2012) done for thyromegaly and history of hyperthyroidism, which showed a multinodular goiter, a diffusely enlarged gland and multiple nodules in the criteria for biopsy. I reviewed the images and it appears that the thyroid gland is replaced by nodules.  She is craving sweets and felt she gained weight since she stopped the MMI. She feels hot (hot flashes), but denies palpitations, tremors, agitation,irritability. She feels the nodules in the in her throat, and mentions that she sometimes feels choked on saliva, but not all the time. No foods dysphagia. No shortness of breath with laying down. She has hoarseness, but this is old (she smokes 2 packs a day).  Mother had thyroid problems (thyroid removed), and her aunt had goiter. No apparent autoimmune ds. In her family.  Past Medical History  The patient is legally blind, has hypertension, obstructive sleep apnea, glaucoma, GERD, COPD-sees Dr. Delford Field with pulmonary, urinary incontinence, arthritis, depression-tried many medications now on Lamictal, she is a history of bariatric surgery in 2011 (lost 120 lbs).   Past Surgical History  Procedure Laterality Date  . Carpal tunnel release      bilateral   . Rotator cuff repair  2000    right shoulder  . Bariatric surgery  2011  . Joint replacement  2012    left knee  . Tubal ligation  1983  . Breast reduction surgery    . Cholecystectomy     History   Social History  . Marital Status: Divorced    Spouse Name: N/A    Number of Children: 1  . Years of Education: N/A   Occupational History  .  Industries [employer]   Social History Main Topics  . Smoking status: Current Every Day Smoker -- 2.00 packs/day    Types: Cigarettes  . Smokeless tobacco: Never Used     Comment: Started smoking at age 70.    Marland Kitchen Alcohol Use: No  . Drug Use: No  . Sexually Active: Not on file   Other Topics Concern  . Not on file   Social History Narrative   Works as a Designer, industrial/product   Pt is single- lives alone   Daughter lives in [place]   Completed 12th grade   Enjoys bowling, blind/visually impaired friends, gambling- internet slots.     Current Outpatient Prescriptions on File Prior to Visit  Medication Sig Dispense Refill  . albuterol (PROAIR HFA) 108 (90 BASE) MCG/ACT inhaler Inhale 2 puffs into the lungs every 4 (four) hours as needed.  3 Inhaler  4  . albuterol (PROVENTIL) (2.5 MG/3ML) 0.083% nebulizer solution Take 3 mLs (2.5 mg total) by nebulization every 6 (six) hours  as needed for wheezing.  150 mL  1  . ALPRAZolam (XANAX) 0.5 MG tablet Take 0.5 mg by mouth 2 (two) times daily as needed.      . bimatoprost (LUMIGAN) 0.01 % SOLN Place 1 drop into both eyes every evening.      . budesonide-formoterol (SYMBICORT) 160-4.5 MCG/ACT inhaler Inhale 2 puffs into the lungs 2 (two) times daily.  3 Inhaler  4  . ibuprofen (ADVIL,MOTRIN) 800 MG tablet Take 1 tablet (800 mg total) by mouth every 8 (eight) hours as needed for pain.  30 tablet  0  . LamoTRIgine 50 MG TBDP Take 1 tablet (50 mg total) by mouth 2 (two) times daily.  180 tablet  0  . Multiple Vitamins-Minerals (ONE DAILY WOMENS PO) Take 1 tablet by mouth daily.      . NON FORMULARY  ColonClenz.  Take 2 capsules twice a day.      . venlafaxine XR (EFFEXOR-XR) 75 MG 24 hr capsule Take 150 mg by mouth daily.      Marland Kitchen amoxicillin-clavulanate (AUGMENTIN) 875-125 MG per tablet Take 1 tablet by mouth 2 (two) times daily.  14 tablet  0  . azithromycin (ZITHROMAX) 250 MG tablet Take 1 tablet (250 mg total) by mouth daily. Take two once then one daily until gone  6 each  0  . fluticasone (FLONASE) 50 MCG/ACT nasal spray Place 2 sprays into the nose daily.  16 g  0  . predniSONE (DELTASONE) 10 MG tablet Take 4 for two days three for two days two for two days one for two days  20 tablet  0   No current facility-administered medications on file prior to visit.   Allergies  Allergen Reactions  . Shellfish Allergy Shortness Of Breath and Swelling   Family History  Problem Relation Age of Onset  . Arthritis Mother   . Hypertension Mother   . Arthritis Father   . Cancer Father     prostate  . Hyperlipidemia Father   . Heart disease Father   . Diabetes Maternal Aunt   . Diabetes Maternal Grandmother   . Hypertension Maternal Grandmother   . Heart disease Maternal Grandfather   . Heart disease Paternal Grandfather     Review of Systems Constitutional: + weight gain, +  fatigue, + subjective hyperthermia - hot flushes, poor sleep Eyes: no blurry vision, no xerophthalmia ENT: no sore throat, + nodules palpated in throat, + dysphagia/no odynophagia, "I get choked sometimes", + hoarseness Cardiovascular: no CP/SOB/palpitations/leg swelling Respiratory: +cough/+ SOB/+ wheezing Gastrointestinal: no N/V/D/C Musculoskeletal: no muscle/+ knee pain and swelling  - L, improved Skin: no rashes; + easy bruising Neurological: no tremors/numbness/tingling/dizziness, + HA Psychiatric: no depression/anxiety  Objective:   Physical Exam BP 140/80  Pulse 83  Temp(Src) 98.1 F (36.7 C) (Oral)  Resp 16  Ht 5\' 6"  (1.676 m)  Wt 214 lb (97.07 kg)  BMI 34.56 kg/m2  SpO2 94%  LMP  07/09/2008 Constitutional: overweight, in NAD Eyes: PERRLA, EOMI, no exophthalmos ENT: moist mucous membranes, fairly symmetric significant thyromegaly, no cervical lymphadenopathy Cardiovascular: RRR, No MRG Respiratory: CTA B Gastrointestinal: abdomen soft, NT, ND, BS+ Musculoskeletal: no deformities, strength intact in all 4 Skin: moist, warm, no rashes Neurological: mild tremor with outstretched hands, DTR normal in right knee, TKR in left knee  Wt Readings from Last 3 Encounters:  08/25/12 214 lb (97.07 kg)  08/18/12 211 lb (95.709 kg)  08/18/12 211 lb (95.709 kg)   Assessment:     1.  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 Smoking - Smokes 2 PPD - knows she needs to quit     Plan:     1.  Patient is a 2 year history of hyperthyroidism, and has been on methimazole 10 mg twice a day initially, 8 months ago this has been decrease to 10 mg, and a month ago it has been stopped. Labs obtained a month ago showed a TSH of  5.4, which indicates that the 10 mg methimazole dose was a little too high. After approximately 3 weeks without methimazole, her TSH has decreased to 0.7, which is not below the lower limit of normal, but shows that she probably is still hyperthyroid. - She also had a recent thyroid ultrasound showing multiple large thyroid nodules, and there is a question whether these are hyperactive - I believe that the next step for now is to obtain a thyroid uptake and scan to see which of these nodules are hyperactive, which are  cold. I advised her to stay off methimazole for now.  - we will likely need to biopsy the cold nodules next - depending on the the amount of uptake, we might need to do a radioactive iodine ablation of the hyperactive nodules, however this will be a subsequent step after biopsying the cold nodules - we discussed about all the above, the patient and daughter agree to plan. Would be difficult for her come to [facility] for the uptake and scan (2 consecutive days) as she is legally blind and does not drive, and her daughter is working. We'll try to arrange for her to have this test in [facility].  - I would repeat her TSH free T4 and free T3 today - I will call her or e-mail her about the results - per her preference - we'll schedule the next visit in 2 months, however we might need to change this depending on the above results  2. Smoking - I strongly encouraged her to try to cut down and then quit. She knows that she needs to do this, and will try.  Office Visit on 08/25/2012  Component Date Value Range Status  . TSH 08/25/2012 0.20* 0.35 - 5.50 uIU/mL Final  . T3, Free 08/25/2012 3.1  2.3 - 4.2 pg/mL Final  . Free T4 08/25/2012 0.94  0.60 - 1.60 ng/dL Final   As expected, TSH continues to decline off MMI. Free T3 and free T4 still normal.  *RADIOLOGY REPORT*  Clinical Data: Hyperthyroidism TSH equals 0.2   THYROID SCAN AND UPTAKE - 24 HOURS  Technique: Following the per oral administration of I-131 sodium  iodide, the patient returned at 24 hours and uptake measurements  were acquired with the uptake probe centered on the neck. Thyroid  imaging was performed following the intravenous administration of  the Tc-72m Pertechnetate.   Radiopharmaceuticals: 15.1 uCi I-131 Sodium Iodide and 10.2 mCi TC-  15m Pertechnetate   Comparison: Thyroid ultrasound 07/23/2012   Findings: The thyroid gland is enlarged and nodular. No discrete  cold nodules are identified or dominant hot nodules.  24  hour I 131 uptake = 46.6 % (normal 10-30%)   IMPRESSION:  1. Findings consistent with toxic multinodular goiter.  2. 24 hour I 131 uptake = 46.6% (normal 10-30%)   Original Report Authenticated By: Genevive Bi, M.D.  Since all her largest nodule have a correspondent increased uptake on the Tc scan imaging, I will assume that all are "hot" and would recommend RAI ablation next. Discussed with pt and explained results >> she agrees with plan. She would like to have it on March 13 when she has an appointment in [place] with GI. I will see her in 1 mo after this.

## 2012-08-26 ENCOUNTER — Encounter: Payer: Self-pay | Admitting: Internal Medicine

## 2012-08-27 ENCOUNTER — Other Ambulatory Visit: Payer: Self-pay | Admitting: *Deleted

## 2012-08-27 ENCOUNTER — Ambulatory Visit: Payer: BC Managed Care – PPO

## 2012-08-27 ENCOUNTER — Telehealth: Payer: Self-pay | Admitting: Critical Care Medicine

## 2012-08-27 DIAGNOSIS — J449 Chronic obstructive pulmonary disease, unspecified: Secondary | ICD-10-CM

## 2012-08-27 NOTE — Telephone Encounter (Signed)
Call the pt and tell her ONO on RA was NORMAL. No oxygen needed

## 2012-08-27 NOTE — Telephone Encounter (Signed)
Called, spoke with pt.  Informed her ONO on RA was normal; no o2 needed per PW.  She verbalized understanding of this and voiced no further questions or concerns at this time.

## 2012-08-28 LAB — FECAL OCCULT BLOOD, IMMUNOCHEMICAL: Fecal Occult Bld: POSITIVE

## 2012-08-29 ENCOUNTER — Telehealth: Payer: Self-pay | Admitting: *Deleted

## 2012-08-29 DIAGNOSIS — R195 Other fecal abnormalities: Secondary | ICD-10-CM

## 2012-08-29 NOTE — Telephone Encounter (Signed)
Left message on home # to return my call. 

## 2012-08-29 NOTE — Telephone Encounter (Signed)
pls let pt know that due to +ifob, i strongly recommend colonoscopy.  would like her to see GI. Needs colon cancer screening.

## 2012-08-29 NOTE — Telephone Encounter (Signed)
Received fax from the lab re: positive IFOB result. Please advise.

## 2012-08-29 NOTE — Telephone Encounter (Signed)
Notified pt and she is agreeable to proceed with referral. Pt has appts at [facility] on Thursday and Friday next week at 9:45 in the mornings. Pt wants to know if we could try to get her appt with [facility] on one of these afternoons if at all possible.

## 2012-08-30 NOTE — Telephone Encounter (Signed)
Myriam Jacobson, could you pls check? thanks

## 2012-09-01 ENCOUNTER — Encounter: Payer: Self-pay | Admitting: Internal Medicine

## 2012-09-02 ENCOUNTER — Encounter: Payer: Self-pay | Admitting: Family

## 2012-09-04 ENCOUNTER — Encounter (HOSPITAL_COMMUNITY)
Admission: RE | Admit: 2012-09-04 | Discharge: 2012-09-04 | Disposition: A | Discharge status: Home / Self Care | Payer: BC Managed Care – PPO | Source: Ambulatory Visit | Attending: Internal Medicine | Admitting: Internal Medicine

## 2012-09-04 ENCOUNTER — Encounter: Payer: Self-pay | Admitting: Internal Medicine

## 2012-09-04 DIAGNOSIS — E0590 Thyrotoxicosis, unspecified without thyrotoxic crisis or storm: Secondary | ICD-10-CM | POA: Insufficient documentation

## 2012-09-05 ENCOUNTER — Encounter (HOSPITAL_COMMUNITY)
Admission: RE | Admit: 2012-09-05 | Discharge: 2012-09-05 | Disposition: A | Discharge status: Home / Self Care | Payer: BC Managed Care – PPO | Source: Ambulatory Visit | Attending: Internal Medicine | Admitting: Internal Medicine

## 2012-09-05 ENCOUNTER — Encounter (HOSPITAL_COMMUNITY): Payer: Self-pay

## 2012-09-05 MED ORDER — SODIUM IODIDE I 131 CAPSULE
15.1000 | Freq: Once | INTRAVENOUS | Status: AC | PRN
  Administered 2012-09-04: 15.1 via ORAL

## 2012-09-05 MED ORDER — SODIUM PERTECHNETATE TC 99M INJECTION
10.2000 | Freq: Once | INTRAVENOUS | Status: AC | PRN
  Administered 2012-09-05: 10.2 via INTRAVENOUS

## 2012-09-08 ENCOUNTER — Encounter: Payer: Self-pay | Admitting: Critical Care Medicine

## 2012-09-08 NOTE — Addendum Note (Signed)
Addended by: Carlus Pavlov on: 09/08/2012 01:09 PM   Modules accepted: Orders

## 2012-09-09 ENCOUNTER — Ambulatory Visit: Payer: BC Managed Care – PPO | Admitting: Internal Medicine

## 2012-09-11 ENCOUNTER — Telehealth: Payer: Self-pay | Admitting: *Deleted

## 2012-09-11 ENCOUNTER — Encounter: Payer: Self-pay | Admitting: Internal Medicine

## 2012-09-11 NOTE — Telephone Encounter (Signed)
Patient notified of date and time of NM RAI Ablation to be done on March 17th, at 9:30 am [facility].Marland Kitchen

## 2012-09-18 ENCOUNTER — Ambulatory Visit (INDEPENDENT_AMBULATORY_CARE_PROVIDER_SITE_OTHER): Payer: BC Managed Care – PPO | Admitting: Internal Medicine

## 2012-09-18 ENCOUNTER — Encounter: Payer: Self-pay | Admitting: Family

## 2012-09-18 ENCOUNTER — Encounter: Payer: Self-pay | Admitting: Internal Medicine

## 2012-09-18 VITALS — BP 122/80 | HR 80 | Ht 64.75 in | Wt 212.2 lb

## 2012-09-18 DIAGNOSIS — R195 Other fecal abnormalities: Secondary | ICD-10-CM

## 2012-09-18 MED ORDER — BISACODYL 5 MG PO TBEC: DELAYED_RELEASE_TABLET | ORAL | Status: DC

## 2012-09-18 MED ORDER — METOCLOPRAMIDE HCL 10 MG PO TABS: ORAL_TABLET | ORAL | Status: DC

## 2012-09-18 MED ORDER — POLYETHYLENE GLYCOL 3350 17 GM/SCOOP PO POWD: ORAL | Status: DC

## 2012-09-18 NOTE — Patient Instructions (Addendum)
You have been scheduled for a colonoscopy with propofol. Please follow written instructions given to you at your visit today.  Please pick up your prep kit at the pharmacy within the next 1-3 days. If you use inhalers (even only as needed), please bring them with you on the day of your procedure.  CC: Dr Macario Carls

## 2012-09-18 NOTE — Progress Notes (Signed)
Patient ID: Audrey Lewis, female   DOB: [birth year], 56 y.o.   MRN: [id number]  SUBJECTIVE: HPI Audrey Lewis is a 57 year old female with a past medical history of hyperthyroidism, sleep apnea, legal blindness, depression, and mild COPD who seen in consultation at the request of Dr. Peggyann Juba for evaluation of positive FOBT. The patient is alone today and has never had a screening colonoscopy. She currently denies abdominal complaints including no abdominal pain, change in bowel habits, diarrhea or constipation. She has no known family history of colorectal cancer. 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 She denies fevers or chills. No recent flare of COPD requiring steroid. 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  Review of Systems  As per history of present illness, otherwise negative   Past Medical History  Diagnosis Date  . Arthritis   . Asthma   . Depression   . History of chicken pox   . COPD (chronic obstructive pulmonary disease)   . GERD (gastroesophageal reflux disease)   . Glaucoma   . Allergy   . Urinary incontinence   . History of high blood pressure   . Hyperthyroidism   . H/O sleep apnea   . Legally blind     color blind  . OSA (obstructive sleep apnea) 08/18/2012    Current Outpatient Prescriptions  Medication Sig Dispense Refill  . albuterol (PROAIR HFA) 108 (90 BASE) MCG/ACT inhaler Inhale 2 puffs into the lungs every 4 (four) hours as needed.  3 Inhaler  4  . albuterol (PROVENTIL) (2.5 MG/3ML) 0.083% nebulizer solution Take 3 mLs (2.5 mg total) by nebulization every 6 (six) hours as needed for wheezing.  150 mL  1  . ALPRAZolam (XANAX) 0.5 MG tablet Take 0.5 mg by mouth 2 (two) times daily as needed.      . bimatoprost (LUMIGAN) 0.01 % SOLN Place 1 drop into both eyes every evening.      . budesonide-formoterol (SYMBICORT) 160-4.5 MCG/ACT inhaler Inhale 2 puffs into the lungs 2 (two)  times daily.  3 Inhaler  4  . fluticasone (FLONASE) 50 MCG/ACT nasal spray Place 2 sprays into the nose daily.  16 g  0  . ibuprofen (ADVIL,MOTRIN) 800 MG tablet Take 1 tablet (800 mg total) by mouth every 8 (eight) hours as needed for pain.  30 tablet  0  . LamoTRIgine 50 MG TBDP Take 1 tablet (50 mg total) by mouth 2 (two) times daily.  180 tablet  0  . Multiple Vitamins-Minerals (ONE DAILY WOMENS PO) Take 1 tablet by mouth daily.      . NON FORMULARY ColonClenz.  Take 2 capsules twice a day.      . venlafaxine XR (EFFEXOR-XR) 75 MG 24 hr capsule Take 150 mg by mouth daily.       No current facility-administered medications for this visit.    Allergies  Allergen Reactions  . Shellfish Allergy Shortness Of Breath and Swelling    Family History  Problem Relation Age of Onset  . Arthritis Mother   . Hypertension Mother   . Arthritis Father   . Prostate cancer Father   . Hyperlipidemia Father   . Heart disease Father   . Diabetes Maternal Aunt   . Diabetes Maternal Grandmother   . Hypertension Maternal Grandmother   . Heart disease Maternal Grandfather   . Heart disease Paternal Grandfather   . Breast cancer Paternal Grandmother   .  Kidney disease Maternal Grandmother   . Crohn's disease Cousin     1st    History  Substance Use Topics  . Smoking status: Current Every Day Smoker -- 2.00 packs/day for 40 years    Types: Cigarettes  . Smokeless tobacco: Never Used     Comment: Started smoking at age 64.    Marland Kitchen Alcohol Use: No    OBJECTIVE: BP 122/80  Pulse 80  Ht 5' 4.75" (1.645 m)  Wt 212 lb 4 oz (96.276 kg)  BMI 35.58 kg/m2  LMP 07/09/2008 Constitutional: Well-developed and well-nourished. No distress. HEENT: Normocephalic and atraumatic. Oropharynx is clear and moist. No scleral icterus. Neck: Neck supple. Trachea midline. Thyromegaly Cardiovascular: Normal rate, regular rhythm and intact distal pulses.  Pulmonary/chest: Effort normal and breath sounds normal. No  wheezing, rales or rhonchi. Abdominal: Soft, obese, nontender, nondistended. Bowel sounds active throughout.  Extremities: no clubbing, cyanosis, or edema Lymphadenopathy: No cervical adenopathy noted. Neurological: Alert and oriented to person place and time. Skin: Skin is warm and dry. No rashes noted. Psychiatric: Normal mood and affect. Behavior is normal.  Labs and Imaging -- FOBT - positive  CBC    Component Value Date/Time   WBC 7.5 08/18/2012 1044   RBC 4.37 08/18/2012 1044   HGB 14.5 08/18/2012 1044   HCT 40.2 08/18/2012 1044   PLT 306 08/18/2012 1044   MCV 92.0 08/18/2012 1044   MCH 33.2 08/18/2012 1044   MCHC 36.1* 08/18/2012 1044   RDW 14.2 08/18/2012 1044   LYMPHSABS 2.4 08/18/2012 1044   MONOABS 0.4 08/18/2012 1044   EOSABS 0.1 08/18/2012 1044   BASOSABS 0.0 08/18/2012 1044   CMP     Component Value Date/Time   NA 144 07/23/2012 1059   K 4.3 07/23/2012 1059   CL 106 07/23/2012 1059   CO2 29 07/23/2012 1059   GLUCOSE 83 07/23/2012 1059   BUN 18 07/23/2012 1059   CREATININE 0.93 07/23/2012 1059   CALCIUM 9.8 07/23/2012 1059   PROT 6.6 07/23/2012 1059   ALBUMIN 4.2 07/23/2012 1059   AST 15 07/23/2012 1059   ALT <8 07/23/2012 1059   ALKPHOS 87 07/23/2012 1059   BILITOT 1.0 07/23/2012 1059    ASSESSMENT AND PLAN:  57 year old female with a past medical history of hyperthyroidism, sleep apnea, legal blindness, depression, and mild COPD who seen in consultation at the request of Dr. Peggyann Juba for evaluation of positive FOBT  1.  + FOBT -- given her age, 57 years old, positive fecal occult blood test I have recommended screening colonoscopy. We discussed the test today including the risks and benefits and she is agreeable to proceed. She is requested MiraLAX prep given her history of gastric bypass and concern of her being able to tolerate a branded prep. MiraLAX prep is fine and she will be given instructions.

## 2012-09-19 ENCOUNTER — Telehealth: Payer: Self-pay | Admitting: Family

## 2012-09-19 MED ORDER — VENLAFAXINE HCL ER 75 MG PO CP24: 150.0000 mg | ORAL_CAPSULE | Freq: Every day | ORAL | Status: DC

## 2012-09-19 NOTE — Telephone Encounter (Signed)
When the patient was in for her last visit  [place]ullivan increased her Effexor to 2 pills per day.  She needs a new rx sent to [facility] at [facility] to reflect this increase in dosage.  She gets 3 months at a time

## 2012-09-19 NOTE — Telephone Encounter (Signed)
OK to refill effexor. Reglan was only to be given prior to colonoscopy.

## 2012-09-19 NOTE — Telephone Encounter (Signed)
Attempted to refill effexor and received message below:  Wasn't sure if we have received this warning before. Please advise if ok to refill.   High   Drug-Drug: metoCLOPramide and venlafaxine XR  Unexpected adverse effects may occur when Metoclopramide and Serotonin Reuptake Blockers are coadministered. Symptoms may include signs of serotonin syndrome with extrapyramidal movements.

## 2012-09-19 NOTE — Telephone Encounter (Signed)
Rx to pharmacy/SLS 

## 2012-09-22 ENCOUNTER — Encounter (HOSPITAL_COMMUNITY)
Admission: RE | Admit: 2012-09-22 | Discharge: 2012-09-22 | Disposition: A | Discharge status: Home / Self Care | Payer: BC Managed Care – PPO | Source: Ambulatory Visit | Attending: Internal Medicine | Admitting: Internal Medicine

## 2012-09-22 DIAGNOSIS — E0520 Thyrotoxicosis with toxic multinodular goiter without thyrotoxic crisis or storm: Secondary | ICD-10-CM | POA: Insufficient documentation

## 2012-09-22 MED ORDER — SODIUM IODIDE I 131 CAPSULE
31.0000 | Freq: Once | INTRAVENOUS | Status: AC | PRN
  Administered 2012-09-22: 31 via ORAL

## 2012-09-29 ENCOUNTER — Encounter: Payer: Self-pay | Admitting: Internal Medicine

## 2012-09-29 ENCOUNTER — Telehealth: Payer: Self-pay | Admitting: *Deleted

## 2012-09-29 NOTE — Telephone Encounter (Signed)
Pt called stating that she had the Nuclear Iodine Ablation done on March 17th and was out of work all week. Pt requesting a note for work to be faxed to [phone], Attn: [employer]. Please advise.

## 2012-09-29 NOTE — Telephone Encounter (Signed)
Audrey Lewis, I put it on your desk - can fax. Thanks!

## 2012-10-01 ENCOUNTER — Telehealth: Payer: Self-pay | Admitting: *Deleted

## 2012-10-01 ENCOUNTER — Encounter: Payer: BC Managed Care – PPO | Admitting: Internal Medicine

## 2012-10-01 NOTE — Telephone Encounter (Signed)
Received call from pt requesting refill of lumigan eye drops. Pt states she cannot get refill from eye doctor until she is seen in the office and has had a lot of appts lately and may be some time before she can get in with them. Advised pt we would not be able to provide rx for this medication and pt voices understanding.

## 2012-10-17 ENCOUNTER — Telehealth: Payer: Self-pay | Admitting: Critical Care Medicine

## 2012-10-17 NOTE — Telephone Encounter (Signed)
Called and spoke to pt on 10/17/12 to make next ov per recall.  She stated she doesn't want an appt right now and will call us @ a later date. Leanora Ivanoff

## 2012-10-20 ENCOUNTER — Ambulatory Visit: Payer: BC Managed Care – PPO | Admitting: Family

## 2012-10-21 ENCOUNTER — Ambulatory Visit: Payer: BC Managed Care – PPO | Admitting: Internal Medicine

## 2012-11-03 ENCOUNTER — Telehealth: Payer: Self-pay | Admitting: Family

## 2012-11-03 ENCOUNTER — Other Ambulatory Visit: Payer: Self-pay | Admitting: Internal Medicine

## 2012-11-03 ENCOUNTER — Ambulatory Visit (INDEPENDENT_AMBULATORY_CARE_PROVIDER_SITE_OTHER): Payer: BC Managed Care – PPO | Admitting: Internal Medicine

## 2012-11-03 ENCOUNTER — Encounter: Payer: Self-pay | Admitting: Internal Medicine

## 2012-11-03 VITALS — BP 136/78 | HR 71 | Temp 98.5°F | Resp 12 | Wt 217.0 lb

## 2012-11-03 DIAGNOSIS — E0590 Thyrotoxicosis, unspecified without thyrotoxic crisis or storm: Secondary | ICD-10-CM

## 2012-11-03 NOTE — Progress Notes (Signed)
Subjective:     Patient ID: Audrey Lewis, female   DOB: 27-Feb-1956, 57 y.o.   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  Pt was dx with hyperthyroidism in 02/2010 by Dr. Drucie Opitz. She was started on MMI. Patient's TSH (08/18/2012) was 0.726, previously, 5.43 (07/23/2012) with a free T3 of 3.4 (2.2-4.2) and a free T4 of 0.81 (0.8-1.8) on Tapazole, but she was taken off Tapazole  the same day.   Pt had a thyroid ultrasound (07/24/2012) done for thyromegaly and history of hyperthyroidism, which showed a multinodular goiter, a diffusely enlarged gland and multiple nodules in the criteria for biopsy. I reviewed the images and it appears that the thyroid gland is replaced by nodules.  I saw her for an initial visit on 08/25/2012 and ordered an Uptake and scan, which returned abnormal: 46.6% uptake at 24h >> pt had RAI ablation on 09/05/2012 (31 mCi). She returns today for a first visit after her RAI ablation.  Last TSH was before her RAI ablation: Lab Results  Component Value Date   TSH 0.20* 08/25/2012   FREET4 0.94 08/25/2012   She c/o weight gain of 5 lbs, increased depression (no SI/HI). She is on Lamictal, Effexor and Xanax. She saw a psychiatrist years ago. She will have an appt with PCP today to address this.   She feels hot (hot flashes), but denies palpitations, tremors, agitation, irritability. She feels the nodules in the in her throat. No dysphagia. No shortness of breath with laying down. She has hoarseness, but this is old (she is a smoker).  Past Medical History  The patient is legally blind, has hypertension, obstructive sleep apnea, glaucoma, GERD, COPD-sees Dr. Delford Field, urinary incontinence, arthritis, history of bariatric surgery in 2011 (lost 120 lbs).   I reviewed pt's medications, allergies, PMH, social hx, family hx and no changes required.  Review of Systems Constitutional: + weight gain, +   fatigue, + subjective hyperthermia - hot flushes, poor sleep Eyes: no blurry vision, no xerophthalmia ENT: no sore throat, + nodules palpated in throat, no dysphagia/no odynophagia, + hoarseness Cardiovascular: no CP/SOB/palpitations/leg swelling Respiratory: no cough/SOB/wheezing Gastrointestinal: no N/V/D/C Musculoskeletal: no muscle/knee pain and swelling  Skin: no rashes;  Neurological: no tremors/numbness/tingling/dizziness Psychiatric: no depression/anxiety  Objective:   Physical Exam BP 136/78  Pulse 71  Temp(Src) 98.5 F (36.9 C) (Oral)  Resp 12  Wt 217 lb (98.431 kg)  BMI 36.37 kg/m2  SpO2 95%  LMP 07/09/2008 Constitutional: overweight, in NAD Eyes: PERRLA, EOMI, no exophthalmos ENT: moist mucous membranes, fairly symmetric significant thyromegaly, no cervical lymphadenopathy Cardiovascular: RRR, No MRG Respiratory: CTA B Gastrointestinal: abdomen soft, NT, ND, BS+ Musculoskeletal: no deformities, strength intact in all 4 Skin: moist, warm, no rashes Neurological: mild tremor with outstretched hands, DTR normal in right knee, TKR in left knee  Wt Readings from Last 3 Encounters:  11/03/12 217 lb (98.431 kg)  09/18/12 212 lb 4 oz (96.276 kg)  08/25/12 214 lb (97.07 kg)   Assessment:     1. Hyperthyroidism - most likely 2/2 toxic MNG - previously on MMI, stopped 07/23/2012 - Thyroid U/S (07/23/2012): there are innumerable nodules in both lobes of the gland Right lobe: 2.0 x 1.6 x 1.2 cm, hyperechoic dominant nodule - mid pole        4.2 x 2.6 x 2.5 cm complex primarily solid nodule - lower pole Left lobe: 1.7 x 1.5 x 1.5 cm  hyperechoic nodule - mid pole      2.1 x 1.8 x 1.8 cm mixed solid and cystic nodule - mid pole      2.5 x 2.1 x 2.5 cm isoechoic nodule - lower pole - Thyroid Uptake and Scan (09/05/2012): 1. Findings consistent with toxic multinodular goiter.  2. 24 hour I 131 uptake = 46.6% (normal 10-30%)  - RAI ablation (09/22/2012): 31 mCI (high  dose)  Plan:     1.  Patient has a 2 year history of hyperthyroidism, and has been on methimazole 10 mg twice a day initially, then decreased to 10 mg, then stopped >> TSH decreased to 0.2. Pt's thyroid ultrasound showed multiple large thyroid nodules, which appeared hyperactive on an uptake and scan. She had RAI ablation ~6 weeks ago, with a rather high dose of radiation. She does c/o several hypothyroid spx today (see HPI). - I will check her TSH and free T4 - I do expect her TSH to be high, based on her symptoms and the high dose of radiation that she received for the ablation - I will call her with her results - per her preference - we'll schedule the next visit in 6 months, but would need to return for labs most likely in 4-6 weeks from now (placed order)  Orders Only on 11/03/2012  Component Date Value Range Status  . TSH 11/03/2012 0.576  0.350 - 4.500 uIU/mL Final  . Free T4 11/03/2012 0.81  0.80 - 1.80 ng/dL Final   Called pt about the normal labs >> recommended to come for a recheck in 4-6 weeks, to check if she develops hypothyroidism.

## 2012-11-03 NOTE — Telephone Encounter (Signed)
Patient states that her Antidepressant [Effexor] medication is subtherapeutic; reports that she is having "moods swings really high & low [mostly low moods]", pt reports no thoughts of hurting herself and/or anyone else/SLS Please advise.

## 2012-11-03 NOTE — Telephone Encounter (Signed)
Would like to talk to [facility] about her anti depressant

## 2012-11-03 NOTE — Telephone Encounter (Signed)
Notified pt. Pt states she has missed so much work recently due to multiple appts that she cannot schedule an appt at this time as she is afraid she will be fired from her job. Pt has colonoscopy scheduled for tomorrow morning. We have no available openings on our schedule tomorrow.  Please advise.

## 2012-11-03 NOTE — Telephone Encounter (Signed)
We can work her in tomorrow afternoon.

## 2012-11-03 NOTE — Telephone Encounter (Signed)
LMOM with contact name and number for return call RE: requested call back from provider for clarification with more information as to issue with antidepressant medication/SLS

## 2012-11-03 NOTE — Patient Instructions (Addendum)
Please return in 6 months for another visit. We'll likely need to repeat your thyroid tests in about 6 weeks, I already placed disorders, you may return here to have labs drawn. I will call you with the results of your labs.

## 2012-11-03 NOTE — Telephone Encounter (Signed)
Lets bring her back in for evaluation.

## 2012-11-04 ENCOUNTER — Ambulatory Visit (AMBULATORY_SURGERY_CENTER): Payer: BC Managed Care – PPO | Admitting: Internal Medicine

## 2012-11-04 ENCOUNTER — Encounter: Payer: Self-pay | Admitting: Family

## 2012-11-04 ENCOUNTER — Encounter: Payer: Self-pay | Admitting: Internal Medicine

## 2012-11-04 ENCOUNTER — Ambulatory Visit (INDEPENDENT_AMBULATORY_CARE_PROVIDER_SITE_OTHER): Payer: BC Managed Care – PPO | Admitting: Family

## 2012-11-04 VITALS — BP 118/80 | HR 72 | Temp 98.2°F | Resp 20 | Wt 215.1 lb

## 2012-11-04 VITALS — BP 132/84 | HR 55 | Temp 97.2°F | Resp 16 | Ht 64.75 in | Wt 212.0 lb

## 2012-11-04 DIAGNOSIS — D126 Benign neoplasm of colon, unspecified: Secondary | ICD-10-CM

## 2012-11-04 DIAGNOSIS — F3289 Other specified depressive episodes: Secondary | ICD-10-CM

## 2012-11-04 DIAGNOSIS — R195 Other fecal abnormalities: Secondary | ICD-10-CM

## 2012-11-04 DIAGNOSIS — F329 Major depressive disorder, single episode, unspecified: Secondary | ICD-10-CM

## 2012-11-04 DIAGNOSIS — Z1211 Encounter for screening for malignant neoplasm of colon: Secondary | ICD-10-CM

## 2012-11-04 DIAGNOSIS — F418 Other specified anxiety disorders: Secondary | ICD-10-CM

## 2012-11-04 DIAGNOSIS — F341 Dysthymic disorder: Secondary | ICD-10-CM

## 2012-11-04 MED ORDER — LAMOTRIGINE 150 MG PO TABS: 75.0000 mg | ORAL_TABLET | Freq: Every day | ORAL | Status: DC

## 2012-11-04 MED ORDER — SODIUM CHLORIDE 0.9 % IV SOLN: 500.0000 mL | INTRAVENOUS | Status: DC

## 2012-11-04 NOTE — Progress Notes (Signed)
Called to room to assist during endoscopic procedure.  Patient ID and intended procedure confirmed with present staff. Received instructions for my participation in the procedure from the performing physician.  

## 2012-11-04 NOTE — Assessment & Plan Note (Signed)
Deteriorated.  I suspect bipolar disorder.  Will plan to continue effexor, and increase lamictal from 50mg  bid to 75 mg bid- refer to psychiatry for ongoing management. She is instructed to go to the ED if she develops suicide/homicidal ideation. She verbalizes understanding.

## 2012-11-04 NOTE — Patient Instructions (Addendum)
Increase lamictal to 75 mg (1/2 tablet of 150mg ) twice daily. You will be contacted about your referral to psychiatry. Go to ER if you develop thoughts of hurting yourself or others.

## 2012-11-04 NOTE — Progress Notes (Signed)
Patient did not experience any of the following events: a burn prior to discharge; a fall within the facility; wrong site/side/patient/procedure/implant event; or a hospital transfer or hospital admission upon discharge from the facility. (G8907) Patient did not have preoperative order for IV antibiotic SSI prophylaxis. (G8918)  

## 2012-11-04 NOTE — Op Note (Signed)
[facility] [address] [place], [address]   COLONOSCOPY PROCEDURE REPORT  PATIENT: Audrey Lewis, Audrey Lewis  MR#: [id number] BIRTHDATE: 1955-11-09 , 56  yrs. old GENDER: Female ENDOSCOPIST: Beverley Fiedler, MD REFERRED NF:[id number] Val EagleLendell Caprice, N.P. PROCEDURE DATE:  11/04/2012 PROCEDURE:   Colonoscopy with biopsy and Colonoscopy with snare polypectomy ASA CLASS:   Class III INDICATIONS:heme-positive stool and first colonoscopy. MEDICATIONS: MAC sedation, administered by CRNA and Propofol (Diprivan) 700 mg IV  DESCRIPTION OF PROCEDURE:   After the risks benefits and alternatives of the procedure were thoroughly explained, informed consent was obtained.  A digital rectal exam revealed no rectal mass.   The LB [id number]-[id number]  endoscope was introduced through the anus and advanced to the cecum, which was identified by both the appendix and ileocecal valve. No adverse events experienced. The quality of the prep was Moviprep fair requiring copious irrigation and lavage.  The instrument was then slowly withdrawn as the colon was fully examined.   COLON FINDINGS: A sessile polyp measuring 10 mm in size was found at the cecum.  A piecemeal polypectomy was performed with a cold snare and with cold forceps at the edges of snare polypectomy.  The resection was complete and the polyp tissue was completely retrieved.   A possible sessile polyp vs. edematous fold measuring 15 mm in size was found in the ascending colon.  Multiple biopsies were performed using cold forceps.   A sessile polyp measuring 5 mm in size was found in the sigmoid colon.  A polypectomy was performed with a cold snare.  The resection was complete and the polyp tissue was completely retrieved.   Mild diverticulosis was noted in the descending colon and sigmoid colon.   Severe melanosis was found throughout the entire examined colon.  Retroflexed views revealed external hemorrhoids. The time to cecum=12 minutes  10 seconds.  Withdrawal time=27 minutes 50 seconds.  The scope was withdrawn and the procedure completed.  COMPLICATIONS: There were no complications.  ENDOSCOPIC IMPRESSION: 1.   Sessile polyp measuring 10 mm in size was found at the cecum; polypectomy was performed with a cold snare and with cold forceps 2.   Possible sessile polyp measuring 15 mm in size was found in the ascending colon; multiple biopsies were performed using cold forceps 3.   Sessile polyp measuring 5 mm in size was found in the sigmoid colon; polypectomy was performed with a cold snare 4.   Mild diverticulosis was noted in the descending colon and sigmoid colon 5.   Severe melanosis was found throughout the entire examined colon       RECOMMENDATIONS: 1.  Hold aspirin, aspirin products, and anti-inflammatory medication for 2 weeks. 2.  Await pathology results 3.  High fiber diet 4.  Timing of repeat colonoscopy will be determined by pathology findings.  Colonoscopy will need to be repeated in 3-6 months to ensure complete polypectomy from the cecum.  This needs to be with 2 day preparation in 1 hour procedure slot. 5.  You will receive a letter within 1-2 weeks with the results of your biopsy as well as final recommendations.  Please call my office if you have not received a letter after 3 weeks.   eSigned:  Beverley Fiedler, MD 11/04/2012 9:31 AM   cc: The Patient   PATIENT NAME:  Audrey Lewis MR#: [id number]

## 2012-11-04 NOTE — Progress Notes (Signed)
Subjective:    Patient ID: Audrey Lewis, female    DOB: June 13, 1956, 57 y.o.   MRN: [id number]  HPI  Ms.  Lewis is a 57 yr old female who presents today to discuss her depression. She is currently maintained on effexor and lamictal.  Reports that she has mood swings.  Feels snappy.  Has felt worse x 3-4 weeks.  She denies formal hx of bipolar disorder but she She has not seen psychiatry in a long time.  She denies active SI/HI.      Review of Systems See HPI  Past Medical History  Diagnosis Date  . Arthritis   . Asthma   . Depression   . History of chicken pox   . COPD (chronic obstructive pulmonary disease)   . GERD (gastroesophageal reflux disease)   . Glaucoma   . Allergy   . Urinary incontinence   . History of high blood pressure   . Hyperthyroidism   . H/O sleep apnea   . Legally blind     color blind  . OSA (obstructive sleep apnea) 08/18/2012    History   Social History  . Marital Status: Divorced    Spouse Name: N/A    Number of Children: 1  . Years of Education: N/A   Occupational History  . 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     Social History Main Topics  . Smoking status: Former Smoker -- 2.00 packs/day for 40 years    Types: Cigarettes    Quit date: 10/01/2012  . Smokeless tobacco: Never Used     Comment: Started smoking at age 24.    Marland Kitchen Alcohol Use: No  . Drug Use: No  . Sexually Active: Not on file   Other Topics Concern  . Not on file   Social History Narrative   Works as a Designer, industrial/product   Pt is single- lives alone   Daughter lives in [place]   Completed 12th grade   Enjoys bowling, blind/visually impaired friends, gambling- internet slots.      Past Surgical History  Procedure Laterality Date  . Carpal tunnel release Bilateral   . Rotator cuff repair Right 2000  . Bariatric surgery  2011  . Joint replacement Left 2012    left knee  . Tubal ligation  1983  . Breast reduction surgery    . Cholecystectomy      with  hernia repair    Family History  Problem Relation Age of Onset  . Arthritis Mother   . Hypertension Mother   . Arthritis Father   . Prostate cancer Father   . Hyperlipidemia Father   . Heart disease Father   . Diabetes Maternal Aunt   . Diabetes Maternal Grandmother   . Hypertension Maternal Grandmother   . Kidney disease Maternal Grandmother   . Heart disease Maternal Grandfather   . Heart disease Paternal Grandfather   . Breast cancer Paternal Grandmother   . Crohn's disease Cousin     1st  . Colon cancer Neg Hx     Allergies  Allergen Reactions  . Shellfish Allergy Shortness Of Breath and Swelling    Current Outpatient Prescriptions on File Prior to Visit  Medication Sig Dispense Refill  . albuterol (PROAIR HFA) 108 (90 BASE) MCG/ACT inhaler Inhale 2 puffs into the lungs every 4 (four) hours as needed.  3 Inhaler  4  . albuterol (PROVENTIL) (2.5 MG/3ML) 0.083% nebulizer solution Take 3 mLs (2.5 mg total) by nebulization  every 6 (six) hours as needed for wheezing.  150 mL  1  . ALPRAZolam (XANAX) 0.5 MG tablet Take 0.5 mg by mouth 2 (two) times daily as needed.      . bimatoprost (LUMIGAN) 0.01 % SOLN Place 1 drop into both eyes every evening.      . budesonide-formoterol (SYMBICORT) 160-4.5 MCG/ACT inhaler Inhale 2 puffs into the lungs 2 (two) times daily.  3 Inhaler  4  . fluticasone (FLONASE) 50 MCG/ACT nasal spray Place 2 sprays into the nose daily.  16 g  0  . ibuprofen (ADVIL,MOTRIN) 800 MG tablet Take 1 tablet (800 mg total) by mouth every 8 (eight) hours as needed for pain.  30 tablet  0  . Multiple Vitamins-Minerals (ONE DAILY WOMENS PO) Take 1 tablet by mouth daily.      . NON FORMULARY ColonClenz.  Take 2 capsules twice a day.      . venlafaxine XR (EFFEXOR-XR) 75 MG 24 hr capsule Take 2 capsules (150 mg total) by mouth daily.  180 capsule  0   Current Facility-Administered Medications on File Prior to Visit  Medication Dose Route Frequency Provider Last Rate  Last Dose  . 0.9 %  sodium chloride infusion  500 mL Intravenous Continuous Beverley Fiedler, MD        BP 118/80  Pulse 72  Temp(Src) 98.2 F (36.8 C) (Oral)  Resp 20  Wt 215 lb 1.3 oz (97.56 kg)  BMI 36.05 kg/m2  SpO2 95%  LMP 07/09/2008       Objective:   Physical Exam  Constitutional: She is oriented to person, place, and time. She appears well-developed and well-nourished. No distress.  Cardiovascular: Normal rate and regular rhythm.   No murmur heard. Pulmonary/Chest: Effort normal and breath sounds normal. No respiratory distress. She has no wheezes. She has no rales. She exhibits no tenderness.  Neurological: She is alert and oriented to person, place, and time.  Psychiatric: She has a normal mood and affect. Her behavior is normal. Judgment and thought content normal.          Assessment & Plan:

## 2012-11-04 NOTE — Patient Instructions (Addendum)

## 2012-11-04 NOTE — Progress Notes (Signed)
NO EGG OR SOY ALLERGY. EWM 

## 2012-11-04 NOTE — Telephone Encounter (Signed)
Notified pt's daughter and advised her to bring pt in to the office at 1:15pm to be worked in this afternoon.

## 2012-11-05 ENCOUNTER — Telehealth: Payer: Self-pay | Admitting: *Deleted

## 2012-11-05 ENCOUNTER — Encounter: Payer: Self-pay | Admitting: Internal Medicine

## 2012-11-05 MED ORDER — LAMOTRIGINE 150 MG PO TABS: 75.0000 mg | ORAL_TABLET | Freq: Two times a day (BID) | ORAL | Status: DC

## 2012-11-05 NOTE — Telephone Encounter (Signed)
  Follow up Call-  Call back number 11/04/2012  Post procedure Call Back phone  # [phone]  Permission to leave phone message Yes     Patient questions:  Do you have a fever, pain , or abdominal swelling? no Pain Score  0 *  Have you tolerated food without any problems? yes  Have you been able to return to your normal activities? yes  Do you have any questions about your discharge instructions: Diet   no Medications  no Follow up visit  no  Do you have questions or concerns about your Care? no  Actions: * If pain score is 4 or above: No action needed, pain <4.

## 2012-11-05 NOTE — Telephone Encounter (Signed)
Should be BID please. Thanks

## 2012-11-05 NOTE — Telephone Encounter (Signed)
Pt left message stating she was told to increase her lamictal to 1/2 tablet twice a day. Bottle she picked up yesterday says 1/2 tablet once a day. Please verify correct dosage?

## 2012-11-06 NOTE — Telephone Encounter (Signed)
OK to continue this dosage? 

## 2012-11-06 NOTE — Telephone Encounter (Signed)
Notified pt of instructions below. Pt states that when she left our office on Tuesday she was feeling very angry ("felt myself bottoming out"). States that she took 2 of her 25mg  tablets and 1/2 of the 150mg  tablet. States that within 1-2 hours she was feeling good and calm again. Reports that yesterday she continued this dose twice a day and reports better mood.  Pt is wanting to know if she can continue this dose? Advised pt to continue 1/2 tablet (75mg ) twice a day until further instruction and she states that she has already taken 125mg  this morning.  Please advise.

## 2012-11-07 NOTE — Telephone Encounter (Signed)
Notified pt. She states she thinks she has 1 refill left on the 25mg  strength. Pt will call back on Monday if she needs this refilled. Medication list has been updated.

## 2012-11-10 ENCOUNTER — Encounter: Payer: Self-pay | Admitting: Internal Medicine

## 2012-11-13 ENCOUNTER — Telehealth: Payer: Self-pay | Admitting: *Deleted

## 2012-11-13 MED ORDER — LAMOTRIGINE 25 MG PO TABS: 50.0000 mg | ORAL_TABLET | Freq: Two times a day (BID) | ORAL | Status: DC

## 2012-11-13 NOTE — Telephone Encounter (Signed)
Pt left message stating pharmacy faxed [place] a request for lamictal 25mg  on Monday. Do not see receipt of previous fax. Rx sent to pharmacy, notified pt.

## 2012-12-03 ENCOUNTER — Ambulatory Visit: Payer: BC Managed Care – PPO | Admitting: Family

## 2012-12-13 DIAGNOSIS — H548 Legal blindness, as defined in USA: Secondary | ICD-10-CM | POA: Insufficient documentation

## 2013-01-06 ENCOUNTER — Telehealth: Payer: Self-pay | Admitting: Internal Medicine

## 2013-01-06 NOTE — Telephone Encounter (Signed)
Lab orders faxed to [phone].

## 2013-01-06 NOTE — Telephone Encounter (Signed)
Ms. Bahar wants a lab order faxed to  [phone] for her thyroid and she wants a standing order

## 2013-01-06 NOTE — Telephone Encounter (Signed)
Will fax the orders - no standing order though.

## 2013-01-08 ENCOUNTER — Telehealth: Payer: Self-pay | Admitting: *Deleted

## 2013-01-08 DIAGNOSIS — E0590 Thyrotoxicosis, unspecified without thyrotoxic crisis or storm: Secondary | ICD-10-CM

## 2013-01-08 NOTE — Telephone Encounter (Signed)
Carollee Herter, the results never came back to me - can you ask them to fax them? Thank you.

## 2013-01-08 NOTE — Telephone Encounter (Signed)
Pt called requesting lab results. [phone]) Please advise. Thank you.

## 2013-01-12 NOTE — Addendum Note (Signed)
Addended by: Carlus Pavlov on: 01/12/2013 12:13 PM   Modules accepted: Orders

## 2013-01-12 NOTE — Telephone Encounter (Signed)
Labs faxed today - drawn on 01/06/2013:   TSH 0.919  Free T4 1.1 Normal.  Carollee Herter, can you please let her know? Thank you.

## 2013-01-12 NOTE — Telephone Encounter (Signed)
Also, I will re-order them for 2 mo from now to make sure she does not develop hypothyroidism. We can fax them to her if she prefers.

## 2013-01-13 ENCOUNTER — Telehealth: Payer: Self-pay | Admitting: *Deleted

## 2013-01-13 NOTE — Telephone Encounter (Signed)
Called pt and let her know that her labs were faxed in today. They were normal. TSH 0.919 Free T4 1.1. Pt concerned because she has gained 15 lbs since taking the radioactive pill. Pt would like some help and advise. Please advise.

## 2013-01-13 NOTE — Telephone Encounter (Signed)
I called and talked to her >> explained thyroid results are perfect. Advised her to try to eat more fruit and vegetables, to cut down on the calories and RTC for recheck tests in 2 months. She agrees to plan.

## 2013-01-23 ENCOUNTER — Encounter: Payer: Self-pay | Admitting: Internal Medicine

## 2013-02-18 ENCOUNTER — Ambulatory Visit (INDEPENDENT_AMBULATORY_CARE_PROVIDER_SITE_OTHER): Payer: BC Managed Care – PPO | Admitting: Family

## 2013-02-18 ENCOUNTER — Encounter: Payer: Self-pay | Admitting: Family

## 2013-02-18 VITALS — BP 130/90 | HR 56 | Temp 97.9°F | Resp 16 | Ht 66.5 in | Wt 239.1 lb

## 2013-02-18 DIAGNOSIS — F418 Other specified anxiety disorders: Secondary | ICD-10-CM

## 2013-02-18 DIAGNOSIS — R030 Elevated blood-pressure reading, without diagnosis of hypertension: Secondary | ICD-10-CM

## 2013-02-18 DIAGNOSIS — E0590 Thyrotoxicosis, unspecified without thyrotoxic crisis or storm: Secondary | ICD-10-CM

## 2013-02-18 DIAGNOSIS — F341 Dysthymic disorder: Secondary | ICD-10-CM

## 2013-02-18 DIAGNOSIS — J449 Chronic obstructive pulmonary disease, unspecified: Secondary | ICD-10-CM

## 2013-02-18 NOTE — Assessment & Plan Note (Signed)
Currently being managed by psychiatry.

## 2013-02-18 NOTE — Assessment & Plan Note (Signed)
She is s/p RIA. This is being managed by endo. She is up 25 pounds since the end of April. We discussed the importance of regular exercise and healthy diet.

## 2013-02-18 NOTE — Assessment & Plan Note (Signed)
Stable on symbicort. Continue same.  ?

## 2013-02-18 NOTE — Assessment & Plan Note (Signed)
We discussed low sodium diet, exercise, weight loss.  BP looks good today in office.  Monitor.

## 2013-02-18 NOTE — Progress Notes (Signed)
Subjective:    Patient ID: Audrey Lewis, female    DOB: Aug 17, 1955, 57 y.o.   MRN: [id number]  HPI  Ms. Audrey Lewis is a 57 yr old female who presents today for follow up.  1) Depression- last visit she was referred to pschiatry. She saw Dr. Sandria Manly in [place]. She reports that lamictal was discontinued this AM and she was placed on a new med- not sure of name.  2) HTN- She is currently maintained on  Low sodium diet alone.  Has had elevated blood pressures at home.  3) hyperthyroid- She is following with endocrinology.  Dr. Elvera Lennox.  She reports that she has gained weight since beginning treatment.  She is not exercising.   4) COPD- reports breathing is at baseline.  She continues symbicort.   .Review of Systems    see HPI  Past Medical History  Diagnosis Date  . Arthritis   . Asthma   . Depression   . History of chicken pox   . COPD (chronic obstructive pulmonary disease)   . GERD (gastroesophageal reflux disease)   . Glaucoma   . Allergy   . Urinary incontinence   . History of high blood pressure   . Hyperthyroidism   . H/O sleep apnea   . Legally blind     color blind  . OSA (obstructive sleep apnea) 08/18/2012    History   Social History  . Marital Status: Divorced    Spouse Name: N/A    Number of Children: 1  . Years of Education: N/A   Occupational History  . 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     Social History Main Topics  . Smoking status: Former Smoker -- 2.00 packs/day for 40 years    Types: Cigarettes    Quit date: 10/01/2012  . Smokeless tobacco: Never Used     Comment: Started smoking at age 11.    Marland Kitchen Alcohol Use: No  . Drug Use: No  . Sexual Activity: Not on file   Other Topics Concern  . Not on file   Social History Narrative   Works as a Designer, industrial/product   Pt is single- lives alone   Daughter lives in [place]   Completed 12th grade   Enjoys bowling, blind/visually impaired friends, gambling- internet slots.      Past  Surgical History  Procedure Laterality Date  . Carpal tunnel release Bilateral   . Rotator cuff repair Right 2000  . Bariatric surgery  2011  . Joint replacement Left 2012    left knee  . Tubal ligation  1983  . Breast reduction surgery    . Cholecystectomy      with hernia repair    Family History  Problem Relation Age of Onset  . Arthritis Mother   . Hypertension Mother   . Arthritis Father   . Prostate cancer Father   . Hyperlipidemia Father   . Heart disease Father   . Diabetes Maternal Aunt   . Diabetes Maternal Grandmother   . Hypertension Maternal Grandmother   . Kidney disease Maternal Grandmother   . Heart disease Maternal Grandfather   . Heart disease Paternal Grandfather   . Breast cancer Paternal Grandmother   . Crohn's disease Cousin     1st  . Colon cancer Neg Hx     Allergies  Allergen Reactions  . Shellfish Allergy Shortness Of Breath and Swelling  . Nitrofurantoin     Other reaction(s): Nausea And Vomiting  Current Outpatient Prescriptions on File Prior to Visit  Medication Sig Dispense Refill  . albuterol (PROAIR HFA) 108 (90 BASE) MCG/ACT inhaler Inhale 2 puffs into the lungs every 4 (four) hours as needed.  3 Inhaler  4  . albuterol (PROVENTIL) (2.5 MG/3ML) 0.083% nebulizer solution Take 3 mLs (2.5 mg total) by nebulization every 6 (six) hours as needed for wheezing.  150 mL  1  . ALPRAZolam (XANAX) 0.5 MG tablet Take 0.5 mg by mouth 2 (two) times daily as needed.      . bimatoprost (LUMIGAN) 0.01 % SOLN Place 1 drop into both eyes every evening.      . budesonide-formoterol (SYMBICORT) 160-4.5 MCG/ACT inhaler Inhale 2 puffs into the lungs 2 (two) times daily.  3 Inhaler  4  . fluticasone (FLONASE) 50 MCG/ACT nasal spray Place 2 sprays into the nose daily.  16 g  0  . ibuprofen (ADVIL,MOTRIN) 800 MG tablet Take 1 tablet (800 mg total) by mouth every 8 (eight) hours as needed for pain.  30 tablet  0  . Multiple Vitamins-Minerals (ONE DAILY WOMENS  PO) Take 1 tablet by mouth daily.      . NON FORMULARY ColonClenz.  Take 2 capsules twice a day.      . venlafaxine XR (EFFEXOR-XR) 75 MG 24 hr capsule Take 2 capsules (150 mg total) by mouth daily.  180 capsule  0  . lamoTRIgine (LAMICTAL) 150 MG tablet Take 0.5 tablets (75 mg total) by mouth 2 (two) times daily.  60 tablet  0  . lamoTRIgine (LAMICTAL) 25 MG tablet Take 2 tablets (50 mg total) by mouth 2 (two) times daily.  120 tablet  1   No current facility-administered medications on file prior to visit.    BP 130/90  Pulse 56  Temp(Src) 97.9 F (36.6 C) (Oral)  Resp 16  Ht 5' 6.5" (1.689 m)  Wt 239 lb 1.3 oz (108.446 kg)  BMI 38.01 kg/m2  SpO2 97%  LMP 07/09/2008    Objective:   Physical Exam  Constitutional: She appears well-developed and well-nourished. No distress.  HENT:  Head: Normocephalic and atraumatic.  Cardiovascular: Normal rate and regular rhythm.   No murmur heard. Pulmonary/Chest: Effort normal and breath sounds normal. No respiratory distress. She has no wheezes. She has no rales. She exhibits no tenderness.  Musculoskeletal: She exhibits no edema.  Psychiatric: She has a normal mood and affect. Her behavior is normal. Judgment and thought content normal.          Assessment & Plan:

## 2013-02-18 NOTE — Patient Instructions (Addendum)
Please follow up in 3 months. Try to get regular exercise with a goal of 30 minutes 5 days a week.

## 2013-05-21 ENCOUNTER — Telehealth: Payer: Self-pay | Admitting: Internal Medicine

## 2013-05-21 NOTE — Telephone Encounter (Signed)
Labs are in - she can come to the lab.

## 2013-05-21 NOTE — Telephone Encounter (Signed)
Patient called stating that she wants to get labs done for radioactive pill for thyroid check. Please advise.

## 2013-05-22 NOTE — Telephone Encounter (Signed)
Pt asked that the lab order be faxed to the [facility] in [place], where she works; so she can get them done there. Lab order printed, signed and faxed to [phone].

## 2013-05-28 ENCOUNTER — Encounter: Payer: Self-pay | Admitting: Internal Medicine

## 2013-05-28 DIAGNOSIS — E0590 Thyrotoxicosis, unspecified without thyrotoxic crisis or storm: Secondary | ICD-10-CM

## 2013-05-28 NOTE — Progress Notes (Signed)
Received labs for pt drawn at [facility] on 05/26/2013:  TSH 0.395 (0.450-4.5)  No free t4 resulted. Will recheck labs in 1 month to see if hyperthyroidism persists.

## 2013-05-29 ENCOUNTER — Telehealth: Payer: Self-pay | Admitting: *Deleted

## 2013-05-29 NOTE — Telephone Encounter (Signed)
When I called pt with her results, she stated that she had bariatric surgery around 3-4 yrs ago. Since she had the radioactive pill, she has gained 45 lbs. Pt stated she does not eat a lot and wants to know what can be done to help. Please advise.

## 2013-05-29 NOTE — Telephone Encounter (Signed)
This does not appear to be from the RAI tx as TFTs are not in the hypothyroid range. Please check back with [facility].

## 2013-06-03 NOTE — Telephone Encounter (Signed)
Pt is aware.  

## 2013-06-08 ENCOUNTER — Ambulatory Visit: Payer: BC Managed Care – PPO | Admitting: Pulmonary Disease

## 2013-06-09 ENCOUNTER — Ambulatory Visit (INDEPENDENT_AMBULATORY_CARE_PROVIDER_SITE_OTHER): Payer: BC Managed Care – PPO | Admitting: Critical Care Medicine

## 2013-06-09 ENCOUNTER — Encounter: Payer: Self-pay | Admitting: Critical Care Medicine

## 2013-06-09 VITALS — BP 132/90 | HR 70 | Temp 98.1°F | Ht 64.0 in | Wt 249.0 lb

## 2013-06-09 DIAGNOSIS — J449 Chronic obstructive pulmonary disease, unspecified: Secondary | ICD-10-CM

## 2013-06-09 DIAGNOSIS — Z23 Encounter for immunization: Secondary | ICD-10-CM

## 2013-06-09 DIAGNOSIS — F17200 Nicotine dependence, unspecified, uncomplicated: Secondary | ICD-10-CM

## 2013-06-09 MED ORDER — ALBUTEROL SULFATE HFA 108 (90 BASE) MCG/ACT IN AERS: 2.0000 | INHALATION_SPRAY | RESPIRATORY_TRACT | Status: DC | PRN

## 2013-06-09 MED ORDER — AZITHROMYCIN 250 MG PO TABS: 250.0000 mg | ORAL_TABLET | Freq: Every day | ORAL | Status: DC

## 2013-06-09 MED ORDER — PREDNISONE 10 MG PO TABS: ORAL_TABLET | ORAL | Status: DC

## 2013-06-09 MED ORDER — BUDESONIDE-FORMOTEROL FUMARATE 160-4.5 MCG/ACT IN AERO: 2.0000 | INHALATION_SPRAY | Freq: Two times a day (BID) | RESPIRATORY_TRACT | Status: DC

## 2013-06-09 NOTE — Progress Notes (Signed)
Subjective:    Patient ID: Audrey Lewis, female    DOB: 12/13/1955, 57 y.o.   MRN: [id number]  HPI Comments: Dx Copd for several years.  On no oxygen.  Pt still smokes 2PPD Previous Bethanie Dicker last seen 6months  06/09/2013 Chief Complaint  Patient presents with  . Acute Visit    Increased SOB with activity over the past 1 month and some wheezing.    Pt notes more dyspna overpast few weeks .  More cough and mucus production. No chest pain.  Pt denies any significant sore throat, nasal congestion or excess secretions, fever, chills, sweats, unintended weight loss, pleurtic or exertional chest pain, orthopnea PND, or leg swelling Pt denies any increase in rescue therapy over baseline, denies waking up needing it or having any early am or nocturnal exacerbations of coughing/wheezing/or dyspnea. Pt also denies any obvious fluctuation in symptoms with  weather or environmental change or other alleviating or aggravating factors   Past Medical History  Diagnosis Date  . Arthritis   . Asthma   . Depression   . History of chicken pox   . COPD (chronic obstructive pulmonary disease)   . GERD (gastroesophageal reflux disease)   . Glaucoma   . Allergy   . Urinary incontinence   . History of high blood pressure   . Hyperthyroidism   . H/O sleep apnea   . Legally blind     color blind  . OSA (obstructive sleep apnea) 08/18/2012     Family History  Problem Relation Age of Onset  . Arthritis Mother   . Hypertension Mother   . Arthritis Father   . Prostate cancer Father   . Hyperlipidemia Father   . Heart disease Father   . Diabetes Maternal Aunt   . Diabetes Maternal Grandmother   . Hypertension Maternal Grandmother   . Kidney disease Maternal Grandmother   . Heart disease Maternal Grandfather   . Heart disease Paternal Grandfather   . Breast cancer Paternal Grandmother   . Crohn's disease Cousin     1st  . Colon cancer Neg Hx      History   Social History  . Marital Status:  Divorced    Spouse Name: N/A    Number of Children: 1  . Years of Education: N/A   Occupational History  . 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     Social History Main Topics  . Smoking status: Current Some Day Smoker -- 2.00 packs/day for 40 years    Types: Cigarettes    Last Attempt to Quit: 10/01/2012  . Smokeless tobacco: Never Used     Comment: Started smoking at age 78.  Currently using ecig.  Will smoke cigeratte occas.    . Alcohol Use: No  . Drug Use: No  . Sexual Activity: Not on file   Other Topics Concern  . Not on file   Social History Narrative   Works as a Designer, industrial/product   Pt is single- lives alone   Daughter lives in [place]   Completed 12th grade   Enjoys bowling, blind/visually impaired friends, gambling- internet slots.       Allergies  Allergen Reactions  . Shellfish Allergy Shortness Of Breath and Swelling  . Nitrofurantoin     Other reaction(s): Nausea And Vomiting     Outpatient Prescriptions Prior to Visit  Medication Sig Dispense Refill  . albuterol (PROVENTIL) (2.5 MG/3ML) 0.083% nebulizer solution Take 3 mLs (2.5 mg total) by nebulization  every 6 (six) hours as needed for wheezing.  150 mL  1  . ALPRAZolam (XANAX) 0.5 MG tablet Take 0.5 mg by mouth 2 (two) times daily as needed.      . bimatoprost (LUMIGAN) 0.01 % SOLN Place 1 drop into both eyes every evening.      Marland Kitchen ibuprofen (ADVIL,MOTRIN) 800 MG tablet Take 1 tablet (800 mg total) by mouth every 8 (eight) hours as needed for pain.  30 tablet  0  . venlafaxine XR (EFFEXOR-XR) 75 MG 24 hr capsule Take 75 mg by mouth daily.      Marland Kitchen albuterol (PROAIR HFA) 108 (90 BASE) MCG/ACT inhaler Inhale 2 puffs into the lungs every 4 (four) hours as needed.  3 Inhaler  4  . budesonide-formoterol (SYMBICORT) 160-4.5 MCG/ACT inhaler Inhale 2 puffs into the lungs 2 (two) times daily.  3 Inhaler  4  . fluticasone (FLONASE) 50 MCG/ACT nasal spray Place 2 sprays into the nose daily.  16 g  0  .  Multiple Vitamins-Minerals (ONE DAILY WOMENS PO) Take 1 tablet by mouth daily.      . NON FORMULARY ColonClenz.  Take 2 capsules twice a day.       No facility-administered medications prior to visit.     Review of Systems  Constitutional: Positive for diaphoresis. Negative for chills, activity change, appetite change, fatigue and unexpected weight change.  HENT: Positive for congestion and sinus pressure. Negative for dental problem, ear discharge, facial swelling, hearing loss, mouth sores, nosebleeds, postnasal drip, sneezing, tinnitus, trouble swallowing and voice change.   Eyes: Negative for photophobia, discharge, itching and visual disturbance.  Respiratory: Positive for cough. Negative for apnea, choking, chest tightness and stridor.   Cardiovascular: Negative for palpitations.  Gastrointestinal: Negative for nausea, constipation, blood in stool and abdominal distention.       NO GERD  Genitourinary: Negative for dysuria, urgency, frequency, hematuria, flank pain, decreased urine volume and difficulty urinating.  Musculoskeletal: Positive for arthralgias. Negative for back pain, gait problem, joint swelling, myalgias and neck stiffness.  Skin: Negative for color change and pallor.  Neurological: Positive for tremors. Negative for dizziness, seizures, syncope, speech difficulty, weakness, light-headedness and numbness.  Hematological: Negative for adenopathy. Does not bruise/bleed easily.  Psychiatric/Behavioral: Negative for confusion, sleep disturbance and agitation. The patient is not nervous/anxious.        Objective:   Physical Exam   Filed Vitals:   06/09/13 1430  BP: 132/90  Pulse: 70  Temp: 98.1 F (36.7 C)  TempSrc: Oral  Height: 5\' 4"  (1.626 m)  Weight: 249 lb (112.946 kg)  SpO2: 97%    Gen: Pleasant, well-nourished, in no distress,  normal affect  ENT: No lesions,  mouth clear,  oropharynx clear, no postnasal drip  Neck: No JVD, no TMG, no carotid  bruits  Lungs: No use of accessory muscles, no dullness to percussion,Distant breath sounds with expired wheezes  Cardiovascular: RRR, heart sounds normal, no murmur or gallops, no peripheral edema  Abdomen: soft and NT, no HSM,  BS normal  Musculoskeletal: No deformities, no cyanosis or clubbing  Neuro: alert, non focal  Skin: Warm, no lesions or rashes  No results found.       Assessment & Plan:   COPD (chronic obstructive pulmonary disease) Copd recurrent exacerbation Plan Pneumovax was given Prednisone 10mg  Take 4 tablets daily for 5 days then stop Azithromycin 250mg  Take two once then one daily until gone Both meds above sent to downstairs pharmacy Work on reducing  the amount of e cig use over the next 4 months Stay on Symbicort Return 4 months     Updated Medication List Outpatient Encounter Prescriptions as of 06/09/2013  Medication Sig  . albuterol (PROAIR HFA) 108 (90 BASE) MCG/ACT inhaler Inhale 2 puffs into the lungs every 4 (four) hours as needed.  Marland Kitchen albuterol (PROVENTIL) (2.5 MG/3ML) 0.083% nebulizer solution Take 3 mLs (2.5 mg total) by nebulization every 6 (six) hours as needed for wheezing.  Marland Kitchen ALPRAZolam (XANAX) 0.5 MG tablet Take 0.5 mg by mouth 2 (two) times daily as needed.  . bimatoprost (LUMIGAN) 0.01 % SOLN Place 1 drop into both eyes every evening.  Marland Kitchen BRINTELLIX 20 MG TABS Take 20 mg by mouth daily.  . budesonide-formoterol (SYMBICORT) 160-4.5 MCG/ACT inhaler Inhale 2 puffs into the lungs 2 (two) times daily.  Marland Kitchen ibuprofen (ADVIL,MOTRIN) 800 MG tablet Take 1 tablet (800 mg total) by mouth every 8 (eight) hours as needed for pain.  Marland Kitchen venlafaxine XR (EFFEXOR-XR) 75 MG 24 hr capsule Take 75 mg by mouth daily.  . [DISCONTINUED] albuterol (PROAIR HFA) 108 (90 BASE) MCG/ACT inhaler Inhale 2 puffs into the lungs every 4 (four) hours as needed.  . [DISCONTINUED] budesonide-formoterol (SYMBICORT) 160-4.5 MCG/ACT inhaler Inhale 2 puffs into the lungs 2 (two)  times daily.  Marland Kitchen azithromycin (ZITHROMAX) 250 MG tablet Take 1 tablet (250 mg total) by mouth daily. Take two once then one daily until gone  . fluticasone (FLONASE) 50 MCG/ACT nasal spray Place 2 sprays into the nose daily.  . predniSONE (DELTASONE) 10 MG tablet Take 4 tablets daily for 5 days then stop  . [DISCONTINUED] Multiple Vitamins-Minerals (ONE DAILY WOMENS PO) Take 1 tablet by mouth daily.  . [DISCONTINUED] NON FORMULARY ColonClenz.  Take 2 capsules twice a day.

## 2013-06-09 NOTE — Patient Instructions (Signed)
Pneumovax was given Prednisone 10mg  Take 4 tablets daily for 5 days then stop Azithromycin 250mg  Take two once then one daily until gone Both meds above sent to downstairs pharmacy Work on reducing the amount of e cig use over the next 4 months Stay on Symbicort Return 4 months

## 2013-06-10 NOTE — Assessment & Plan Note (Signed)
Copd recurrent exacerbation Plan Pneumovax was given Prednisone 10mg  Take 4 tablets daily for 5 days then stop Azithromycin 250mg  Take two once then one daily until gone Both meds above sent to downstairs pharmacy Work on reducing the amount of e cig use over the next 4 months Stay on Symbicort Return 4 months

## 2013-06-22 ENCOUNTER — Encounter: Payer: Self-pay | Admitting: Internal Medicine

## 2013-07-06 ENCOUNTER — Other Ambulatory Visit: Payer: Self-pay

## 2013-07-06 DIAGNOSIS — E0590 Thyrotoxicosis, unspecified without thyrotoxic crisis or storm: Secondary | ICD-10-CM

## 2013-07-16 ENCOUNTER — Telehealth: Payer: Self-pay | Admitting: *Deleted

## 2013-07-16 NOTE — Telephone Encounter (Signed)
Received labs for pt drawn at [facility] on 07/07/2013:  TSH 0.578 (0.450-4.5)  No free t4 resulted. Free T3 113 (71-180) All normal >> will need a repeat in ~ 2 months. Larene Beach, please check with her if we need to fax an order for TSH, free T4 and free T3 to her to take to [facility].

## 2013-07-16 NOTE — Telephone Encounter (Signed)
Let's check with the lab as I did not get the results.

## 2013-07-16 NOTE — Telephone Encounter (Signed)
Pt called requesting lab results. Pt states she had them done at work (through [employer]) and they say they have been resulted. Please advise.

## 2013-07-17 ENCOUNTER — Other Ambulatory Visit: Payer: Self-pay | Admitting: *Deleted

## 2013-07-17 DIAGNOSIS — E0590 Thyrotoxicosis, unspecified without thyrotoxic crisis or storm: Secondary | ICD-10-CM

## 2013-10-01 ENCOUNTER — Encounter: Payer: Self-pay | Admitting: Internal Medicine

## 2013-10-01 NOTE — Progress Notes (Signed)
Received labs for pt drawn at [facility] on 09/29/2013:  TSH 1.080 (0.450-4.5)  Normal >> will need a repeat when comes back.

## 2013-10-22 LAB — HM MAMMOGRAPHY

## 2013-10-26 ENCOUNTER — Encounter: Payer: Self-pay | Admitting: Family

## 2013-10-27 ENCOUNTER — Encounter: Payer: Self-pay | Admitting: Internal Medicine

## 2014-01-15 ENCOUNTER — Ambulatory Visit: Payer: BC Managed Care – PPO | Admitting: Family

## 2014-01-15 ENCOUNTER — Ambulatory Visit (INDEPENDENT_AMBULATORY_CARE_PROVIDER_SITE_OTHER): Payer: BC Managed Care – PPO | Admitting: Family

## 2014-01-15 ENCOUNTER — Encounter: Payer: Self-pay | Admitting: Family

## 2014-01-15 VITALS — BP 154/96 | HR 76 | Temp 97.9°F | Resp 18 | Ht 66.5 in | Wt 258.1 lb

## 2014-01-15 DIAGNOSIS — E0590 Thyrotoxicosis, unspecified without thyrotoxic crisis or storm: Secondary | ICD-10-CM

## 2014-01-15 DIAGNOSIS — R252 Cramp and spasm: Secondary | ICD-10-CM

## 2014-01-15 DIAGNOSIS — F418 Other specified anxiety disorders: Secondary | ICD-10-CM

## 2014-01-15 DIAGNOSIS — I10 Essential (primary) hypertension: Secondary | ICD-10-CM | POA: Insufficient documentation

## 2014-01-15 DIAGNOSIS — F17200 Nicotine dependence, unspecified, uncomplicated: Secondary | ICD-10-CM

## 2014-01-15 DIAGNOSIS — F341 Dysthymic disorder: Secondary | ICD-10-CM

## 2014-01-15 LAB — TSH: TSH: 0.838 u[IU]/mL (ref 0.350–4.500)

## 2014-01-15 LAB — BASIC METABOLIC PANEL
BUN: 16 mg/dL (ref 6–23)
CHLORIDE: 104 meq/L (ref 96–112)
CO2: 30 mEq/L (ref 19–32)
Calcium: 9.2 mg/dL (ref 8.4–10.5)
Creat: 0.7 mg/dL (ref 0.50–1.10)
Glucose, Bld: 74 mg/dL (ref 70–99)
Potassium: 3.6 mEq/L (ref 3.5–5.3)
SODIUM: 143 meq/L (ref 135–145)

## 2014-01-15 LAB — T4, FREE: FREE T4: 1.04 ng/dL (ref 0.80–1.80)

## 2014-01-15 LAB — T3, FREE: T3 FREE: 2.9 pg/mL (ref 2.3–4.2)

## 2014-01-15 MED ORDER — HYDROCHLOROTHIAZIDE 25 MG PO TABS: 25.0000 mg | ORAL_TABLET | Freq: Every day | ORAL | Status: DC

## 2014-01-15 MED ORDER — LORCASERIN HCL 10 MG PO TABS: 10.0000 mg | ORAL_TABLET | Freq: Two times a day (BID) | ORAL | Status: DC

## 2014-01-15 NOTE — Progress Notes (Signed)
Pre visit review using our clinic review tool, if applicable. No additional management support is needed unless otherwise documented below in the visit note. 

## 2014-01-15 NOTE — Patient Instructions (Addendum)
You can set up a weight loss plan on my fitness pal online. Eliminate sodas- switch to water. Start belviq. Get some exercise every day.  Goal 30 min walking 5 days a week. Start nicotine patch 51mcg daily for 1 month, then 37mcg daily for 1 month, then 68mcg daily for 1 month. Please schedule follow up with Dr. Cruzita Lederer. Start HCTZ for blood pressure and swelling. Follow up with [place] in 2 weeks.

## 2014-01-15 NOTE — Assessment & Plan Note (Signed)
Stable, followed by Dr. Erling Cruz.  Management per psych.

## 2014-01-15 NOTE — Assessment & Plan Note (Signed)
Discussed healthy diet, exercise, weight loss. Pt would like to try Belviq. Rx provided.

## 2014-01-15 NOTE — Assessment & Plan Note (Addendum)
Overdue for follow up with endo. Will obtain TFT's. Advised pt to schedule follow up with endo. Muscle cramping could be related to thyroid. Will obtain bmet as well.

## 2014-01-15 NOTE — Assessment & Plan Note (Signed)
BP up.  + edema, start hctz. Follow up in 2 weeks for bmet and bp check.

## 2014-01-15 NOTE — Assessment & Plan Note (Signed)
We discussed tobacco abuse for 3-5 minutes. Discussed nicotine patch.

## 2014-01-15 NOTE — Progress Notes (Signed)
Subjective:    Patient ID: Audrey Lewis, female    DOB: 08/26/1955, 58 y.o.   MRN: [id number]  HPI  Audrey Lewis is a 58 yr old female who presents today for follow up.  1) Hyperthyroid- follows with Dr. Cruzita Lederer.  S/p RIA 1 year go.   2) Depression- She is followed by Dr. Erling Cruz.   Wt Readings from Last 3 Encounters:  01/15/14 258 lb 1.9 oz (117.082 kg)  06/09/13 249 lb (112.946 kg)  02/18/13 239 lb 1.3 oz (108.446 kg)   3) Weight Gain- would like to try Belviq. She drinks soda. Body mass index is 41.04 kg/(m^2).   4) tobacco abuse- smokes 2PPD      Review of Systems See hpi  Past Medical History  Diagnosis Date  . Arthritis   . Asthma   . Depression   . History of chicken pox   . COPD (chronic obstructive pulmonary disease)   . GERD (gastroesophageal reflux disease)   . Glaucoma   . Allergy   . Urinary incontinence   . History of high blood pressure   . Hyperthyroidism   . H/O sleep apnea   . Legally blind     color blind  . OSA (obstructive sleep apnea) 08/18/2012    History   Social History  . Marital Status: Divorced    Spouse Name: N/A    Number of Children: 1  . Years of Education: N/A   Occupational History  . 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     Social History Main Topics  . Smoking status: Current Some Day Smoker -- 2.00 packs/day for 40 years    Types: Cigarettes    Last Attempt to Quit: 10/01/2012  . Smokeless tobacco: Never Used     Comment: Started smoking at age 66.  Currently using ecig.  Will smoke cigeratte occas.    . Alcohol Use: No  . Drug Use: No  . Sexual Activity: Not on file   Other Topics Concern  . Not on file   Social History Narrative   Works as a Social research officer, government   Pt is single- lives alone   Daughter lives in [place]   Completed 12th grade   Enjoys bowling, blind/visually impaired friends, gambling- internet slots.      Past Surgical History  Procedure Laterality Date  . Carpal tunnel release  Bilateral   . Rotator cuff repair Right 2000  . Bariatric surgery  2011  . Joint replacement Left 2012    left knee  . Tubal ligation  1983  . Breast reduction surgery    . Cholecystectomy      with hernia repair    Family History  Problem Relation Age of Onset  . Arthritis Mother   . Hypertension Mother   . Arthritis Father   . Prostate cancer Father   . Hyperlipidemia Father   . Heart disease Father   . Diabetes Maternal Aunt   . Diabetes Maternal Grandmother   . Hypertension Maternal Grandmother   . Kidney disease Maternal Grandmother   . Heart disease Maternal Grandfather   . Heart disease Paternal Grandfather   . Breast cancer Paternal Grandmother   . Crohn's disease Cousin     1st  . Colon cancer Neg Hx     Allergies  Allergen Reactions  . Shellfish Allergy Shortness Of Breath and Swelling  . Nitrofurantoin     Other reaction(s): Nausea And Vomiting    Current Outpatient Prescriptions  on File Prior to Visit  Medication Sig Dispense Refill  . albuterol (PROAIR HFA) 108 (90 BASE) MCG/ACT inhaler Inhale 2 puffs into the lungs every 4 (four) hours as needed.  3 Inhaler  4  . albuterol (PROVENTIL) (2.5 MG/3ML) 0.083% nebulizer solution Take 3 mLs (2.5 mg total) by nebulization every 6 (six) hours as needed for wheezing.  150 mL  1  . bimatoprost (LUMIGAN) 0.01 % SOLN Place 1 drop into both eyes every evening.      Marland Kitchen BRINTELLIX 20 MG TABS Take 20 mg by mouth daily.      . budesonide-formoterol (SYMBICORT) 160-4.5 MCG/ACT inhaler Inhale 2 puffs into the lungs 2 (two) times daily.  3 Inhaler  4  . venlafaxine XR (EFFEXOR-XR) 75 MG 24 hr capsule Take 150 mg by mouth at bedtime.        No current facility-administered medications on file prior to visit.    BP 154/96  Pulse 76  Temp(Src) 97.9 F (36.6 C) (Oral)  Resp 18  Ht 5' 6.5" (1.689 m)  Wt 258 lb 1.9 oz (117.082 kg)  BMI 41.04 kg/m2  SpO2 97%  LMP 07/09/2008       Objective:   Physical Exam    Constitutional: She is oriented to person, place, and time. She appears well-developed and well-nourished. No distress.  Cardiovascular: Normal rate and regular rhythm.   No murmur heard. Pulmonary/Chest: Effort normal and breath sounds normal. No respiratory distress. She has no wheezes. She has no rales. She exhibits no tenderness.  Musculoskeletal:  2+ bilateral LE edema  Neurological: She is alert and oriented to person, place, and time.  Psychiatric: She has a normal mood and affect. Her behavior is normal. Judgment and thought content normal.          Assessment & Plan:

## 2014-01-16 ENCOUNTER — Encounter: Payer: Self-pay | Admitting: Family

## 2014-01-19 ENCOUNTER — Telehealth: Payer: Self-pay | Admitting: Family

## 2014-01-19 ENCOUNTER — Telehealth: Payer: Self-pay | Admitting: *Deleted

## 2014-01-19 NOTE — Telephone Encounter (Signed)
Patient is requesting results from last ov

## 2014-01-19 NOTE — Telephone Encounter (Signed)
Received email via cover my meds for prior auth on Belviq.  Form submitted electronically. Awaiting determination:  Your information has been submitted to [employer] [place]. [employer] will review the request and fax you a determination directly, typically within 3 business days of your submission once all necessary information is received. If [employer] has not responded in 3 business days or if you have any questions about your submission, contact [employer] at [phone].

## 2014-01-19 NOTE — Telephone Encounter (Signed)
Notified pt of normal labs per 01/16/14 lab letter. Pt wants to know why she is having cramps in her legs and what can she do to get them to stop? Please advise.

## 2014-01-19 NOTE — Telephone Encounter (Signed)
I am not certain the cause of her cramping. Sometimes build up of lactic acid from prolonged standing/walking activity can cause cramping as well as dehydration.  She should make sure she is drinking 6-8 glasses of water a day and doing regular stretching. We can check magnesium at her follow up appointment.

## 2014-01-20 NOTE — Telephone Encounter (Signed)
Notified pt and she states cramping is not just in her legs but is going all over her body. Has cramping in her abdomen as well. Please advise if any further instructions.

## 2014-01-20 NOTE — Telephone Encounter (Signed)
No further recs at this time.  Will check mag at her follow up apt.

## 2014-01-20 NOTE — Telephone Encounter (Signed)
Received fax from [facility] with Approval valid 07.14.15--01.09.16; faxed to [facility] [phone]; [facility] with contact name and number to inform of Rx approval/SLS

## 2014-02-01 ENCOUNTER — Ambulatory Visit (INDEPENDENT_AMBULATORY_CARE_PROVIDER_SITE_OTHER): Payer: BC Managed Care – PPO | Admitting: Family

## 2014-02-01 VITALS — BP 140/100 | HR 75 | Temp 98.3°F | Resp 18 | Ht 66.5 in | Wt 253.1 lb

## 2014-02-01 DIAGNOSIS — IMO0002 Reserved for concepts with insufficient information to code with codable children: Secondary | ICD-10-CM

## 2014-02-01 DIAGNOSIS — I10 Essential (primary) hypertension: Secondary | ICD-10-CM

## 2014-02-01 DIAGNOSIS — F424 Excoriation (skin-picking) disorder: Secondary | ICD-10-CM | POA: Insufficient documentation

## 2014-02-01 NOTE — Assessment & Plan Note (Signed)
She has lost 5 pounds. Continue belviq.

## 2014-02-01 NOTE — Assessment & Plan Note (Signed)
BP remains uncontrolled.  Resume hctz.  She needs bp and BMET/Mag drawn in 2 weeks. She would like to have done at her job so she does not need to miss work.  Lab order given to pt.  I have asked her forward labs and BP results to me.

## 2014-02-01 NOTE — Progress Notes (Signed)
Subjective:    Patient ID: Audrey Lewis, female    DOB: January 28, 1956, 59 y.o.   MRN: [id number]  HPI  Audrey Lewis is a 58 yr old female who presents today for follow up.  HTN-  Last visit BP was elevated and she had worsening lower extremity edema. It was recommended that she start hctz.  She took for 1 week but did not feel that this was helping her LE edema so she discontinued.   BP Readings from Last 3 Encounters:  02/01/14 140/100  01/15/14 154/96  06/09/13 132/90   Morbid obesity- she was given rx for Belviq last visit.  She has lost 5 pounds.  Reports that she continues to pick at scabs and has created sores on her right arm and left foot. Scratches in her sleep.    Review of Systems    see HPI  Past Medical History  Diagnosis Date  . Arthritis   . Asthma   . Depression   . History of chicken pox   . COPD (chronic obstructive pulmonary disease)   . GERD (gastroesophageal reflux disease)   . Glaucoma   . Allergy   . Urinary incontinence   . History of high blood pressure   . Hyperthyroidism   . H/O sleep apnea   . Legally blind     color blind  . OSA (obstructive sleep apnea) 08/18/2012    History   Social History  . Marital Status: Divorced    Spouse Name: N/A    Number of Children: 1  . Years of Education: N/A   Occupational History  . 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     Social History Main Topics  . Smoking status: Current Some Day Smoker -- 2.00 packs/day for 40 years    Types: Cigarettes    Last Attempt to Quit: 10/01/2012  . Smokeless tobacco: Never Used     Comment: Started smoking at age 65.  Currently using ecig.  Will smoke cigeratte occas.    . Alcohol Use: No  . Drug Use: No  . Sexual Activity: Not on file   Other Topics Concern  . Not on file   Social History Narrative   Works as a Social research officer, government   Pt is single- lives alone   Daughter lives in [place]   Completed 12th grade   Enjoys bowling, blind/visually  impaired friends, gambling- internet slots.      Past Surgical History  Procedure Laterality Date  . Carpal tunnel release Bilateral   . Rotator cuff repair Right 2000  . Bariatric surgery  2011  . Joint replacement Left 2012    left knee  . Tubal ligation  1983  . Breast reduction surgery    . Cholecystectomy      with hernia repair    Family History  Problem Relation Age of Onset  . Arthritis Mother   . Hypertension Mother   . Arthritis Father   . Prostate cancer Father   . Hyperlipidemia Father   . Heart disease Father   . Diabetes Maternal Aunt   . Diabetes Maternal Grandmother   . Hypertension Maternal Grandmother   . Kidney disease Maternal Grandmother   . Heart disease Maternal Grandfather   . Heart disease Paternal Grandfather   . Breast cancer Paternal Grandmother   . Crohn's disease Cousin     1st  . Colon cancer Neg Hx     Allergies  Allergen Reactions  . Shellfish Allergy  Shortness Of Breath and Swelling  . Nitrofurantoin     Other reaction(s): Nausea And Vomiting    Current Outpatient Prescriptions on File Prior to Visit  Medication Sig Dispense Refill  . albuterol (PROAIR HFA) 108 (90 BASE) MCG/ACT inhaler Inhale 2 puffs into the lungs every 4 (four) hours as needed.  3 Inhaler  4  . albuterol (PROVENTIL) (2.5 MG/3ML) 0.083% nebulizer solution Take 3 mLs (2.5 mg total) by nebulization every 6 (six) hours as needed for wheezing.  150 mL  1  . bimatoprost (LUMIGAN) 0.01 % SOLN Place 1 drop into both eyes every evening.      Marland Kitchen BRINTELLIX 20 MG TABS Take 20 mg by mouth daily.      . budesonide-formoterol (SYMBICORT) 160-4.5 MCG/ACT inhaler Inhale 2 puffs into the lungs 2 (two) times daily.  3 Inhaler  4  . Lorcaserin HCl (BELVIQ) 10 MG TABS Take 10 mg by mouth 2 (two) times daily.  60 tablet  0  . venlafaxine XR (EFFEXOR-XR) 75 MG 24 hr capsule Take 150 mg by mouth at bedtime.       . hydrochlorothiazide (HYDRODIURIL) 25 MG tablet Take 1 tablet (25 mg  total) by mouth daily.  30 tablet  1   No current facility-administered medications on file prior to visit.    BP 140/100  Pulse 75  Temp(Src) 98.3 F (36.8 C) (Oral)  Resp 18  Ht 5' 6.5" (1.689 m)  Wt 253 lb 1.3 oz (114.796 kg)  BMI 40.24 kg/m2  SpO2 99%  LMP 07/09/2008    Objective:   Physical Exam  Constitutional: She is oriented to person, place, and time. She appears well-developed and well-nourished. No distress.  HENT:  Head: Normocephalic and atraumatic.  Cardiovascular: Normal rate and regular rhythm.   No murmur heard. Pulmonary/Chest: Effort normal and breath sounds normal. No respiratory distress. She has no wheezes. She has no rales. She exhibits no tenderness.  Musculoskeletal:  1+ bilateral LE edema.  Neurological: She is alert and oriented to person, place, and time.  Skin:  Sore right forearm and left dorsal foot.   Psychiatric: She has a normal mood and affect. Her behavior is normal. Judgment and thought content normal.          Assessment & Plan:  Form was filled to help provide transportation due to impaired vision and copd.

## 2014-02-01 NOTE — Assessment & Plan Note (Signed)
Advised her to keep lesions covered and avoid picking to allow healing. .  Triple antibiotic ointment and bandages applied today.

## 2014-02-01 NOTE — Patient Instructions (Signed)
Restart hctz. Have blood pressure and lab work drawn at your job in 2 weeks. Send me the blood work and blood pressure. Follow up here in 3 months.

## 2014-02-01 NOTE — Progress Notes (Signed)
Pre visit review using our clinic review tool, if applicable. No additional management support is needed unless otherwise documented below in the visit note. 

## 2014-03-18 ENCOUNTER — Encounter: Payer: Self-pay | Admitting: Pulmonary Disease

## 2014-03-18 ENCOUNTER — Ambulatory Visit (HOSPITAL_BASED_OUTPATIENT_CLINIC_OR_DEPARTMENT_OTHER)
Admission: RE | Admit: 2014-03-18 | Discharge: 2014-03-18 | Disposition: A | Discharge status: Home / Self Care | Payer: BC Managed Care – PPO | Source: Ambulatory Visit | Attending: Pulmonary Disease | Admitting: Pulmonary Disease

## 2014-03-18 ENCOUNTER — Ambulatory Visit (INDEPENDENT_AMBULATORY_CARE_PROVIDER_SITE_OTHER): Payer: BC Managed Care – PPO | Admitting: Pulmonary Disease

## 2014-03-18 VITALS — BP 140/80 | HR 88 | Temp 98.6°F | Ht 64.0 in | Wt 251.0 lb

## 2014-03-18 DIAGNOSIS — J449 Chronic obstructive pulmonary disease, unspecified: Secondary | ICD-10-CM | POA: Insufficient documentation

## 2014-03-18 DIAGNOSIS — I10 Essential (primary) hypertension: Secondary | ICD-10-CM | POA: Insufficient documentation

## 2014-03-18 DIAGNOSIS — J42 Unspecified chronic bronchitis: Secondary | ICD-10-CM

## 2014-03-18 DIAGNOSIS — J45909 Unspecified asthma, uncomplicated: Secondary | ICD-10-CM | POA: Diagnosis present

## 2014-03-18 DIAGNOSIS — J4489 Other specified chronic obstructive pulmonary disease: Secondary | ICD-10-CM | POA: Insufficient documentation

## 2014-03-18 DIAGNOSIS — G4733 Obstructive sleep apnea (adult) (pediatric): Secondary | ICD-10-CM

## 2014-03-18 NOTE — Assessment & Plan Note (Signed)
Given excessive daytime somnolence, narrow pharyngeal exam, witnessed apneas & loud snoring, obstructive sleep apnea is very likely & an overnight polysomnogram will be scheduled as a split study. The pathophysiology of obstructive sleep apnea , it's cardiovascular consequences & modes of treatment including CPAP were discused with the patient in detail & they evidenced understanding.  

## 2014-03-18 NOTE — Progress Notes (Signed)
Subjective:    Patient ID: Audrey Lewis, female    DOB: August 24, 1955, 58 y.o.   MRN: [id number]  HPI  58 y.o obese woman for evaluation of OSA . She sees PW for COPD but would like to switch.  She was diagnosed with OSA , many yrs ago & placed on a CPAP with full face mask, lost this when she moved. She underwent bariatric surgery 4 yrs ago & lost from 330 to lowest 190 lbs but has regained to 251 now. She is legally blind Reports epworth sleepiness score is 18. 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 Bedtime is around 10p, sleep latency 57-57m ins, sleeps on her side x 2 pillows, several nocturnal awakenings, loud snoring noted by family members, oob around 57-45 am (since she has to take public transport to work) feeling tired with dryness of mouth. There is no history suggestive of cataplexy, sleep paralysis or parasomnias  She smokes 2 PPD & is overusing her symbicort. Cannot afford nicotine patches & has not tried other agents. She repors occasional wheezing & 1-2 chest colds in a year   Past Medical History  Diagnosis Date  . Arthritis   . Asthma   . Depression   . History of chicken pox   . COPD (chronic obstructive pulmonary disease)   . GERD (gastroesophageal reflux disease)   . Glaucoma   . Allergy   . Urinary incontinence   . History of high blood pressure   . Hyperthyroidism   . H/O sleep apnea   . Legally blind     color blind  . OSA (obstructive sleep apnea) 08/18/2012   Past Surgical History  Procedure Laterality Date  . Carpal tunnel release Bilateral   . Rotator cuff repair Right 2000  . Bariatric surgery  2011  . Joint replacement Left 2012    left knee  . Tubal ligation  1983  . Breast reduction surgery    . Cholecystectomy      with hernia repair    Allergies  Allergen Reactions  . Shellfish Allergy Shortness Of Breath and Swelling  .  Nitrofurantoin     Other reaction(s): Nausea And Vomiting    History   Social History  . Marital Status: Divorced    Spouse Name: N/A    Number of Children: 1  . Years of Education: N/A   Occupational History  . 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     Social History Main Topics  . Smoking status: Current Some Day Smoker -- 2.00 packs/day for 40 years    Types: Cigarettes    Last Attempt to Quit: 10/01/2012  . Smokeless tobacco: Never Used     Comment: Started smoking at age 16.  Currently using ecig.  Will smoke cigeratte occas.    . Alcohol Use: No  . Drug Use: No  . Sexual Activity: Not on file   Other Topics Concern  . Not on file   Social History Narrative   Works as a Social research officer, government   Pt is single- lives alone   Daughter lives in [place]   Completed 12th grade   Enjoys bowling, blind/visually impaired friends, gambling- internet slots.      Family History  Problem Relation Age of Onset  . Arthritis Mother   . Hypertension Mother   . Arthritis Father   .  Prostate cancer Father   . Hyperlipidemia Father   . Heart disease Father   . Diabetes Maternal Aunt   . Diabetes Maternal Grandmother   . Hypertension Maternal Grandmother   . Kidney disease Maternal Grandmother   . Heart disease Maternal Grandfather   . Heart disease Paternal Grandfather   . Breast cancer Paternal Grandmother   . Crohn's disease Cousin     1st  . Colon cancer Neg Hx      Review of Systems  Constitutional: Negative for fever and unexpected weight change.  HENT: Negative for congestion, dental problem, ear pain, nosebleeds, postnasal drip, rhinorrhea, sinus pressure, sneezing, sore throat and trouble swallowing.   Eyes: Negative for redness and itching.  Respiratory: Negative for cough, chest tightness, shortness of breath and wheezing.   Cardiovascular: Negative for palpitations and leg swelling.  Gastrointestinal: Negative for nausea and vomiting.    Genitourinary: Negative for dysuria.  Musculoskeletal: Negative for joint swelling.  Skin: Negative for rash.  Neurological: Negative for headaches.  Hematological: Does not bruise/bleed easily.  Psychiatric/Behavioral: Negative for dysphoric mood. The patient is not nervous/anxious.        Objective:   Physical Exam  Gen. Pleasant, obese, in no distress, normal affect ENT - no lesions, no post nasal drip, class 2-3 airway Neck: No JVD, no thyromegaly, no carotid bruits Lungs: no use of accessory muscles, no dullness to percussion, decreased without rales or rhonchi  Cardiovascular: Rhythm regular, heart sounds  normal, no murmurs or gallops, no peripheral edema Abdomen: soft and non-tender, no hepatosplenomegaly, BS normal. Musculoskeletal: No deformities, no cyanosis or clubbing Neuro:  alert, non focal, no tremors        Assessment & Plan:

## 2014-03-18 NOTE — Assessment & Plan Note (Signed)
You have to QUIT smoking -Try nicotine patch x 2 weeks - call me back for Rx for nicotine inhaler after this CXR today Decrease symbicort to twice daily  Use albuterol prn only Spirometry in future -may need to add spiriva

## 2014-03-18 NOTE — Patient Instructions (Signed)
Sleep study will be scheduled You have to QUIT smoking -Try nicotine patch x 2 weeks - call me back for Rx for nicotine inhaler after this CXR today

## 2014-03-24 ENCOUNTER — Telehealth: Payer: Self-pay | Admitting: Pulmonary Disease

## 2014-03-24 NOTE — Telephone Encounter (Signed)
Notes Recorded by Rigoberto Noel, MD on 03/18/2014 at 4:20 PM Changes of COPD Prior markings of pna resolved  Spoke with pt and notified of results per Dr. Elsworth Soho. Pt verbalized understanding and denied any questions.

## 2014-03-24 NOTE — Progress Notes (Signed)
Quick Note:  [id number] ______ 

## 2014-03-24 NOTE — Progress Notes (Signed)
Quick Note:  Spoke with pt and notified of results per Dr. Alva Pt verbalized understanding and denied any questions.  ______ 

## 2014-03-26 ENCOUNTER — Telehealth: Payer: Self-pay | Admitting: *Deleted

## 2014-03-26 MED ORDER — HYDROCHLOROTHIAZIDE 25 MG PO TABS: 25.0000 mg | ORAL_TABLET | Freq: Every day | ORAL | Status: DC

## 2014-03-26 NOTE — Telephone Encounter (Signed)
Received fax from [facility] requesting refill of HCTZ. Refill sent. Pt will be due for follow up on or after 05/04/14.  Please call pt to arrange appt.

## 2014-03-29 NOTE — Telephone Encounter (Signed)
Informed patient of medication refill and she scheduled appointment for early november

## 2014-04-19 ENCOUNTER — Encounter: Payer: Self-pay | Admitting: Family

## 2014-04-19 ENCOUNTER — Ambulatory Visit (INDEPENDENT_AMBULATORY_CARE_PROVIDER_SITE_OTHER): Payer: BC Managed Care – PPO | Admitting: Family

## 2014-04-19 VITALS — BP 120/80 | HR 84 | Temp 98.1°F | Resp 20 | Ht 66.5 in | Wt 256.0 lb

## 2014-04-19 DIAGNOSIS — F418 Other specified anxiety disorders: Secondary | ICD-10-CM

## 2014-04-19 DIAGNOSIS — I10 Essential (primary) hypertension: Secondary | ICD-10-CM

## 2014-04-19 NOTE — Progress Notes (Signed)
Subjective:    Patient ID: Audrey Lewis, female    DOB: [birth year], 58 y.o.   MRN: [id number]  HPI  Audrey Lewis is a 58 yr old female who presents today for follow up.  1) HTN-denies CP or swelling.  Recent bronchitis which has improved.   BP Readings from Last 3 Encounters:  04/19/14 120/80  03/18/14 140/80  02/01/14 140/100    2) Obesity- started on belviq.  Didn't help. Reports that her insurance covered it.She took it for a few weeks. Did not lose any weight.  Wt Readings from Last 3 Encounters:  04/19/14 256 lb (116.121 kg)  03/18/14 251 lb (113.853 kg)  02/01/14 253 lb 1.3 oz (114.796 kg)    3) Depression- wants to quit smoking. She is interested in trying wellbutrin and wishes to discuss with Dr. Erling Cruz, her psychiatrist.    Review of Systems See HPI  Past Medical History  Diagnosis Date  . Arthritis   . Asthma   . Depression   . History of chicken pox   . COPD (chronic obstructive pulmonary disease)   . GERD (gastroesophageal reflux disease)   . Glaucoma   . Allergy   . Urinary incontinence   . History of high blood pressure   . Hyperthyroidism   . H/O sleep apnea   . Legally blind     color blind  . OSA (obstructive sleep apnea) 08/18/2012    History   Social History  . Marital Status: Divorced    Spouse Name: N/A    Number of Children: 1  . Years of Education: N/A   Occupational History  . 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     Social History Main Topics  . Smoking status: Current Some Day Smoker -- 2.00 packs/day for 40 years    Types: Cigarettes    Last Attempt to Quit: 10/01/2012  . Smokeless tobacco: Never Used     Comment: Started smoking at age 18.  Currently using ecig.  Will smoke cigeratte occas.    . Alcohol Use: No  . Drug Use: No  . Sexual Activity: Not on file   Other Topics Concern  . Not on file   Social History Narrative   Works as a Social research officer, government   Pt is single- lives alone   Daughter lives in [place]     Completed 12th grade   Enjoys bowling, blind/visually impaired friends, gambling- internet slots.      Past Surgical History  Procedure Laterality Date  . Carpal tunnel release Bilateral   . Rotator cuff repair Right 2000  . Bariatric surgery  2011  . Joint replacement Left 2012    left knee  . Tubal ligation  1983  . Breast reduction surgery    . Cholecystectomy      with hernia repair    Family History  Problem Relation Age of Onset  . Arthritis Mother   . Hypertension Mother   . Arthritis Father   . Prostate cancer Father   . Hyperlipidemia Father   . Heart disease Father   . Diabetes Maternal Aunt   . Diabetes Maternal Grandmother   . Hypertension Maternal Grandmother   . Kidney disease Maternal Grandmother   . Heart disease Maternal Grandfather   . Heart disease Paternal Grandfather   . Breast cancer Paternal Grandmother   . Crohn's disease Cousin     1st  . Colon cancer Neg Hx     Allergies  Allergen  Reactions  . Shellfish Allergy Shortness Of Breath and Swelling  . Nitrofurantoin     Other reaction(s): Nausea And Vomiting    Current Outpatient Prescriptions on File Prior to Visit  Medication Sig Dispense Refill  . albuterol (PROAIR HFA) 108 (90 BASE) MCG/ACT inhaler Inhale 2 puffs into the lungs every 4 (four) hours as needed.  3 Inhaler  4  . albuterol (PROVENTIL) (2.5 MG/3ML) 0.083% nebulizer solution Take 3 mLs (2.5 mg total) by nebulization every 6 (six) hours as needed for wheezing.  150 mL  1  . bimatoprost (LUMIGAN) 0.01 % SOLN Place 1 drop into both eyes every evening.      Marland Kitchen BRINTELLIX 20 MG TABS Take 20 mg by mouth daily.      . budesonide-formoterol (SYMBICORT) 160-4.5 MCG/ACT inhaler Inhale 2 puffs into the lungs 2 (two) times daily.  3 Inhaler  4  . hydrochlorothiazide (HYDRODIURIL) 25 MG tablet Take 1 tablet (25 mg total) by mouth daily.  30 tablet  1  . venlafaxine XR (EFFEXOR-XR) 75 MG 24 hr capsule Take 150 mg by mouth at bedtime.         No current facility-administered medications on file prior to visit.    BP 120/80  Pulse 84  Temp(Src) 98.1 F (36.7 C) (Oral)  Resp 20  Ht 5' 6.5" (1.689 m)  Wt 256 lb (116.121 kg)  BMI 40.71 kg/m2  SpO2 95%  LMP 07/09/2008       Objective:   Physical Exam  Constitutional: She is oriented to person, place, and time. She appears well-developed and well-nourished. No distress.  HENT:  Head: Normocephalic and atraumatic.  Cardiovascular: Normal rate and regular rhythm.   No murmur heard. Pulmonary/Chest: Effort normal and breath sounds normal. No respiratory distress. She has no wheezes. She has no rales. She exhibits no tenderness.  Musculoskeletal: She exhibits no edema.  Neurological: She is alert and oriented to person, place, and time.  Psychiatric: She has a normal mood and affect. Her behavior is normal. Judgment and thought content normal.          Assessment & Plan:

## 2014-04-19 NOTE — Patient Instructions (Signed)
Please complete lab work prior to leaving. Work on healthy diet, exercise and weight loss.  Follow up in 6 months.  

## 2014-04-19 NOTE — Progress Notes (Signed)
Pre visit review using our clinic review tool, if applicable. No additional management support is needed unless otherwise documented below in the visit note. 

## 2014-04-20 LAB — BASIC METABOLIC PANEL
BUN: 31 mg/dL — ABNORMAL HIGH (ref 6–23)
CALCIUM: 9.6 mg/dL (ref 8.4–10.5)
CO2: 30 mEq/L (ref 19–32)
Chloride: 100 mEq/L (ref 96–112)
Creatinine, Ser: 1.1 mg/dL (ref 0.4–1.2)
GFR: 56.7 mL/min — AB (ref >60.00)
Glucose, Bld: 87 mg/dL (ref 70–99)
Potassium: 3.5 mEq/L (ref 3.5–5.1)
Sodium: 138 mEq/L (ref 135–145)

## 2014-04-21 ENCOUNTER — Telehealth: Payer: Self-pay | Admitting: *Deleted

## 2014-04-21 DIAGNOSIS — R7989 Other specified abnormal findings of blood chemistry: Secondary | ICD-10-CM

## 2014-04-21 NOTE — Telephone Encounter (Signed)
Spoke with pt. She voices understanding and states she will not be able to return for repeat blood test due to missing time from work. States her employer is able to obtain lab specimens and they have done this for her in the past. She will call us back with fax # to send lab order to.

## 2014-04-21 NOTE — Telephone Encounter (Signed)
Message copied by Ronny Flurry on Wed Apr 21, 2014 11:40 AM ------      Message from: O'SULLIVAN, MELISSA      Created: Tue Apr 20, 2014  4:17 PM       Lab work shows that she may have been a bit dehydrated the day we saw her. Increase water intake. Repeat bmet in 1 month- diagnosis is azotemia. ------

## 2014-04-24 NOTE — Assessment & Plan Note (Signed)
Stable will discuss with Dr. Erling Cruz adding wellbutrin for smoking cessation.

## 2014-04-24 NOTE — Assessment & Plan Note (Signed)
We discussed healthy diet, exercise.  

## 2014-04-24 NOTE — Assessment & Plan Note (Addendum)
BP stable, continue HCTZ.  Obtain bmet.

## 2014-05-10 ENCOUNTER — Ambulatory Visit: Payer: BC Managed Care – PPO | Admitting: Family

## 2014-05-14 ENCOUNTER — Encounter (HOSPITAL_BASED_OUTPATIENT_CLINIC_OR_DEPARTMENT_OTHER): Payer: BC Managed Care – PPO

## 2014-05-28 ENCOUNTER — Other Ambulatory Visit: Payer: Self-pay | Admitting: Family

## 2014-06-10 IMAGING — CR DG CHEST 2V
2 series · 2 of 2 positions shown · non-contrast
Comparison: None.

CLINICAL DATA: Cough, COPD exacerbation

CHEST - 2 VIEW

[w chest pa]
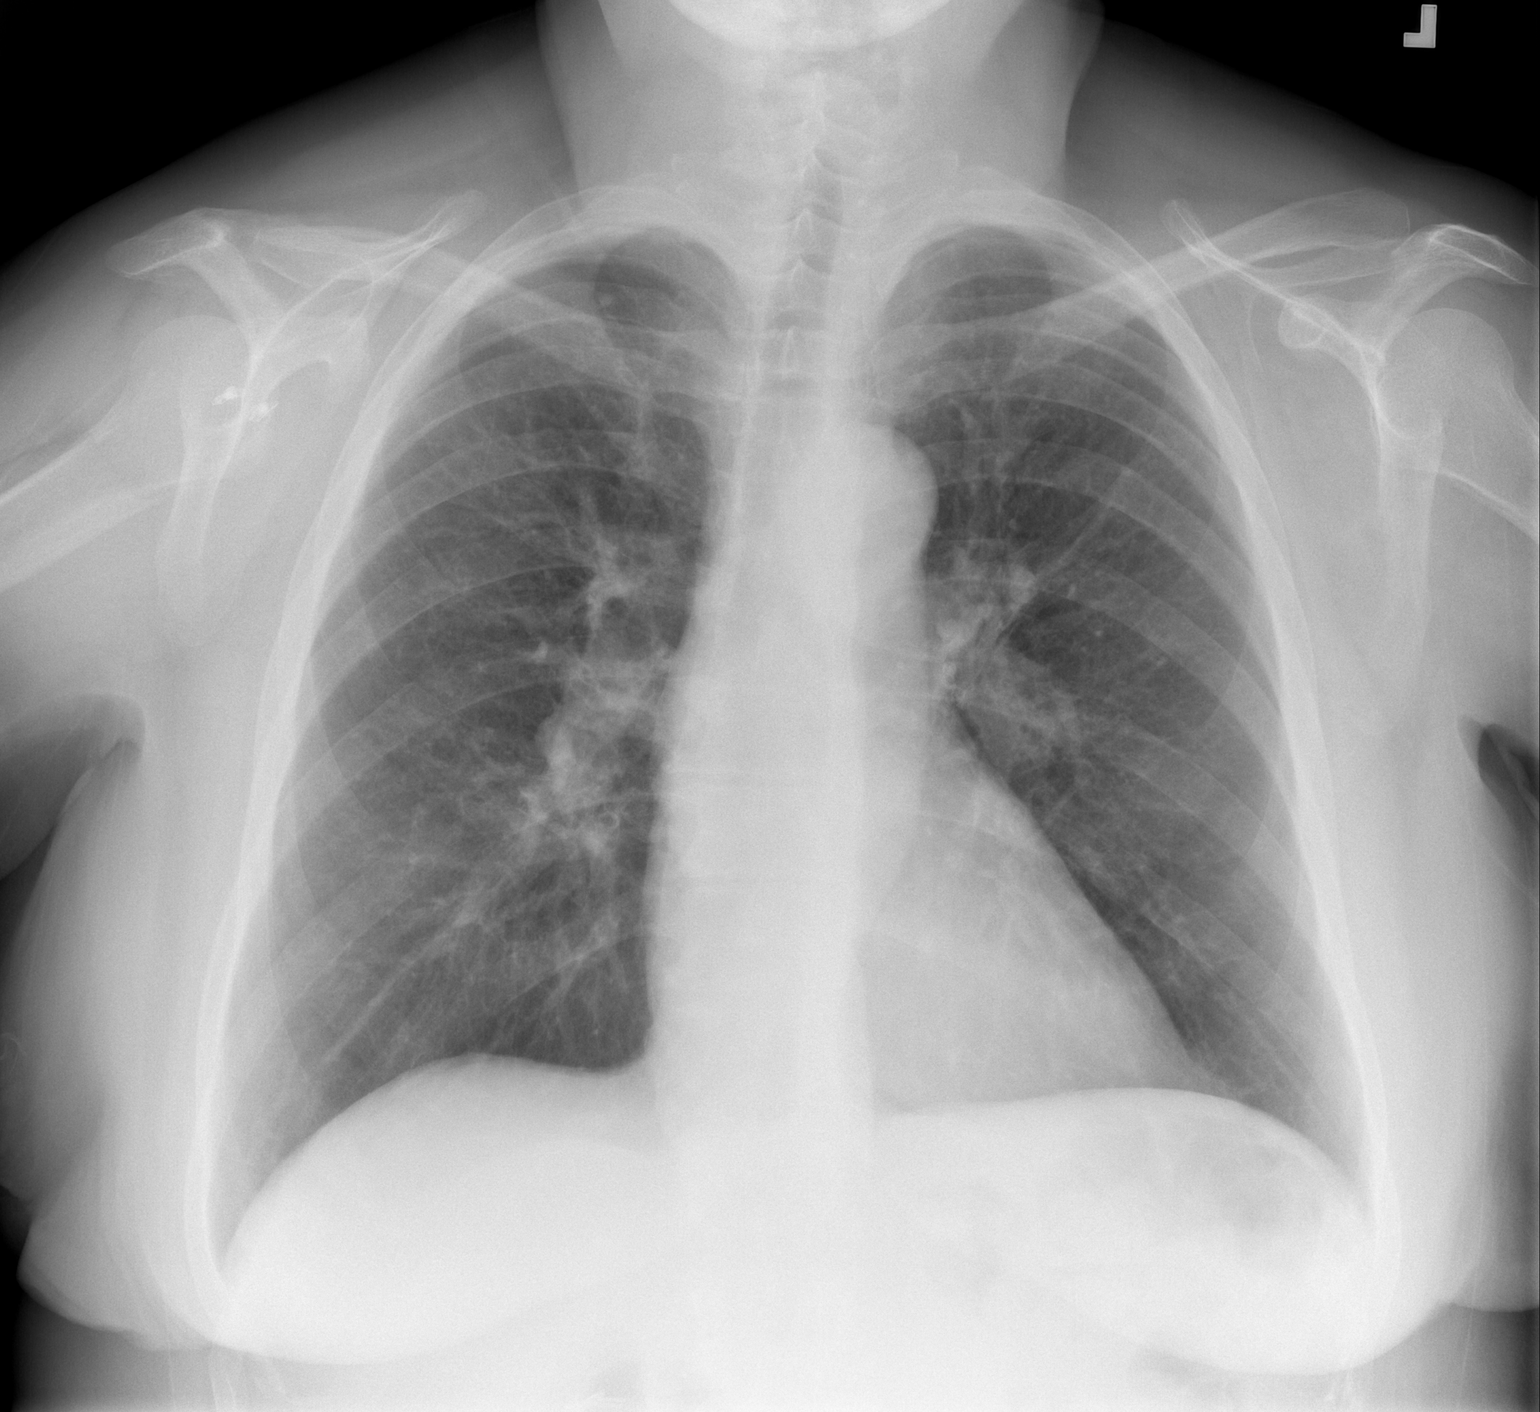

[w chest lat]
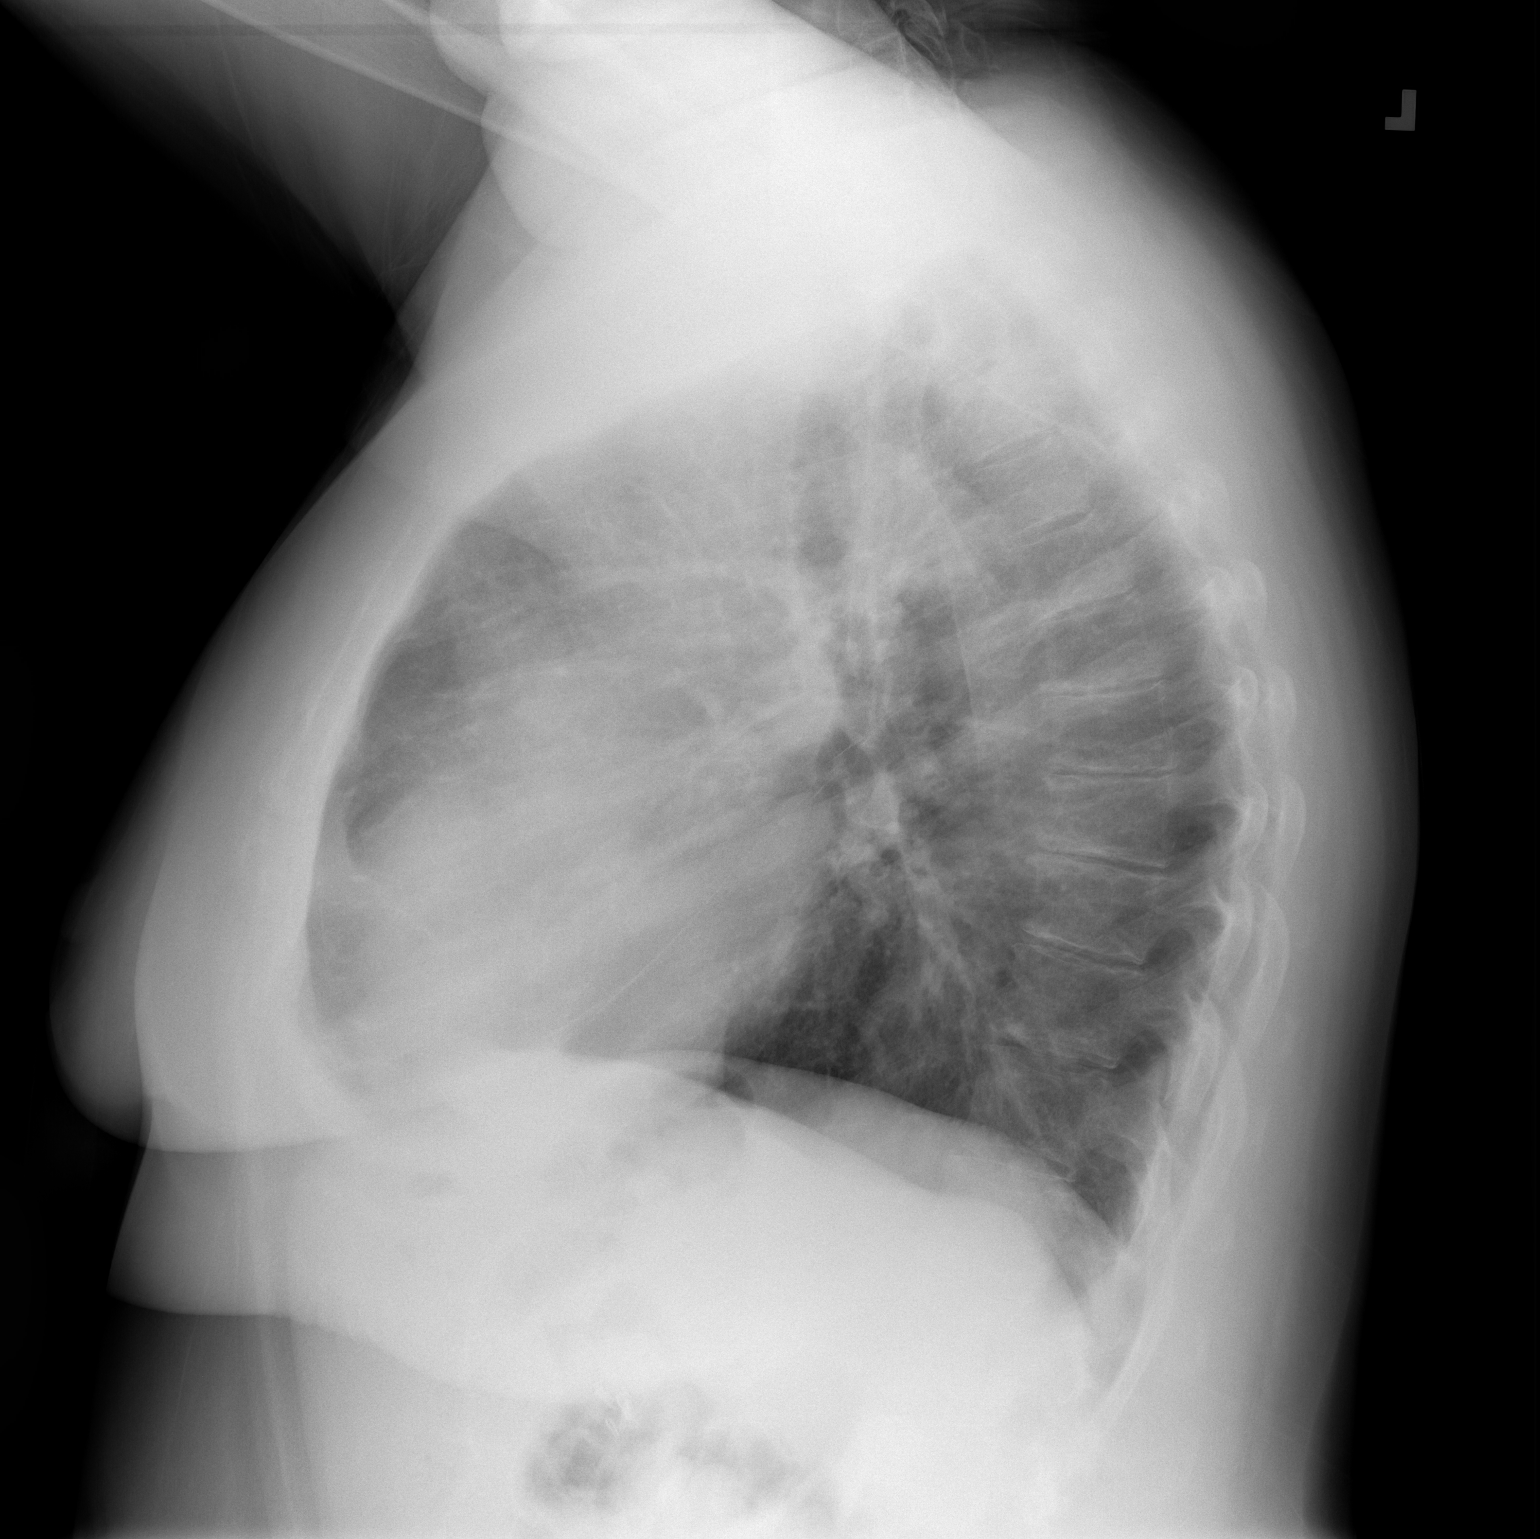

[2 of 2 positions shown; findings below may reference images not displayed]

FINDINGS: Mild patchy right upper lobe opacities, suspicious for
pneumonia. No pleural effusion or pneumothorax.

Cardiomediastinal silhouette is within normal limits.

Mild degenerative changes of the visualized thoracolumbar spine.
IMPRESSION: Mild patchy right upper lobe opacities, suspicious for pneumonia.

## 2014-07-05 ENCOUNTER — Telehealth: Payer: Self-pay | Admitting: Critical Care Medicine

## 2014-07-05 NOTE — Telephone Encounter (Signed)
She is sick also wants to see some one thursday

## 2014-07-05 NOTE — Telephone Encounter (Signed)
Pt having increased SOB and requests OV-- states that she saw the physician at her job and they gave her a 12 day pred taper and an abx (she thinks ZPAK).  Pt scheduled with TP 07/08/14 at 10am.  Pt requesting to switch physicians from Dr Joya Gaskins to Dr Elsworth Soho. Pt states that she is just wanting to keep the same Doctor for the reasons she is seen here (sleep and pulmonary). Pt would like to switch to [place] seeing that he covers both specialties. Pt aware that I will speak with both Dr Joya Gaskins and Dr Elsworth Soho about this switch and will call her back.   Dr Joya Gaskins please advise. Thanks.

## 2014-07-06 NOTE — Telephone Encounter (Signed)
OK with me.

## 2014-07-06 NOTE — Telephone Encounter (Signed)
LMTCB for the pt 

## 2014-07-06 NOTE — Telephone Encounter (Signed)
i am fine with this change

## 2014-07-06 NOTE — Telephone Encounter (Signed)
Dr Elsworth Soho please advise if you are okay with this switch--pt is wanting to see you for Pulm and Sleep. Thanks.

## 2014-07-07 NOTE — Telephone Encounter (Signed)
Pt returning call - [phone]

## 2014-07-07 NOTE — Telephone Encounter (Signed)
i called and lmom to make the pt aware of her appt tomorrow with TP and that RA will see her for both sleep and pulmonary issues.  Nothing further is needed.

## 2014-07-07 NOTE — Telephone Encounter (Signed)
Pt made appt to see TP 07/08/14 @ 10am States she will schedule appt with RA at OV tomorrow. Nothing further needed.

## 2014-07-08 ENCOUNTER — Encounter: Payer: Self-pay | Admitting: Adult Health

## 2014-07-08 ENCOUNTER — Ambulatory Visit (INDEPENDENT_AMBULATORY_CARE_PROVIDER_SITE_OTHER): Payer: BC Managed Care – PPO | Admitting: Adult Health

## 2014-07-08 VITALS — BP 118/82 | HR 89 | Temp 98.1°F | Ht 64.0 in | Wt 260.8 lb

## 2014-07-08 DIAGNOSIS — J42 Unspecified chronic bronchitis: Secondary | ICD-10-CM

## 2014-07-08 DIAGNOSIS — G4733 Obstructive sleep apnea (adult) (pediatric): Secondary | ICD-10-CM

## 2014-07-08 MED ORDER — PREDNISONE 10 MG PO TABS: ORAL_TABLET | ORAL | Status: DC

## 2014-07-08 MED ORDER — NICOTINE 10 MG IN INHA: 1.0000 | RESPIRATORY_TRACT | Status: DC | PRN

## 2014-07-08 MED ORDER — AMOXICILLIN-POT CLAVULANATE 875-125 MG PO TABS: 1.0000 | ORAL_TABLET | Freq: Two times a day (BID) | ORAL | Status: AC

## 2014-07-08 NOTE — Assessment & Plan Note (Signed)
Exacerbation and ongoing smoking  Plan  Augmentin 875mg  Twice daily  For 7 days .  Prednisone taper over next week.  Mucinex DM Twice daily  As needed cough/ congestion  May use Nicotrol inhaler to help quit smoking, set quit date.  Follow up Dr. Elsworth Soho  In 6 weeks with PFT .  Please contact office for sooner follow up if symptoms do not improve or worsen or seek emergency care

## 2014-07-08 NOTE — Patient Instructions (Addendum)
Augmentin 875mg  Twice daily  For 7 days .  Prednisone taper over next week.  Mucinex DM Twice daily  As needed cough/ congestion  May use Nicotrol inhaler to help quit smoking, set quit date.  Follow up Dr. Elsworth Soho  In 6 weeks with PFT .  Please contact office for sooner follow up if symptoms do not improve or worsen or seek emergency care

## 2014-07-08 NOTE — Progress Notes (Signed)
   Subjective:    Patient ID: Audrey Lewis, female    DOB: 01-14-1956, 58 y.o.   MRN: [id number]  HPI 58 yo smoker with COPD    07/08/2014 Acute OV  Complains of cough, congestion , thick mucus, and wheezing. Was seen by Work MD given Zpack. No better.  Remains on Symbicort.   Never came back for PFT .  She denies any chest pain, orthopnea, PND, or increased leg swelling  Wants to quit smoking . Patches did not work., unable to take Chantix d/t more depression. Wants to try to Nicotrol inhaler. Smoked 1-2 PPD , has cut down to 1 PPD.   Was supposed to have a sleep study. However, insurance Would not cover and she could not afford the high deductible    Review of Systems Constitutional:   No  weight loss, night sweats,  Fevers, chills,  +fatigue, or  lassitude.  HEENT:   No headaches,  Difficulty swallowing,  Tooth/dental problems, or  Sore throat,                No sneezing, itching, ear ache,  +nasal congestion, post nasal drip,   CV:  No chest pain,  Orthopnea, PND, swelling in lower extremities, anasarca, dizziness, palpitations, syncope.   GI  No heartburn, indigestion, abdominal pain, nausea, vomiting, diarrhea, change in bowel habits, loss of appetite, bloody stools.   Resp:.  No chest wall deformity  Skin: no rash or lesions.  GU: no dysuria, change in color of urine, no urgency or frequency.  No flank pain, no hematuria   MS:  No joint pain or swelling.  No decreased range of motion.  No back pain.  Psych:  No change in mood or affect. No depression or anxiety.  No memory loss.          Objective:   Physical Exam GEN: A/Ox3; pleasant , NAD, obese   HEENT:  /AT,  EACs-clear, TMs-wnl, NOSE-clear, THROAT-clear, no lesions, no postnasal drip or exudate noted.   NECK:  Supple w/ fair ROM; no JVD; normal carotid impulses w/o bruits; no thyromegaly or nodules palpated; no lymphadenopathy.  RESP   Few exp wheezes no accessory muscle use, no dullness to  percussion  CARD:  RRR, no m/r/g  , no peripheral edema, pulses intact, no cyanosis or clubbing.  GI:   Soft & nt; nml bowel sounds; no organomegaly or masses detected.  Musco: Warm bil, no deformities or joint swelling noted.   Neuro: alert, no focal deficits noted.    Skin: Warm, no lesions or rashes         Assessment & Plan:

## 2014-07-10 ENCOUNTER — Other Ambulatory Visit: Payer: Self-pay | Admitting: Family

## 2014-07-10 ENCOUNTER — Other Ambulatory Visit: Payer: Self-pay | Admitting: Critical Care Medicine

## 2014-07-11 NOTE — Progress Notes (Signed)
Reviewed & agree with plan  

## 2014-07-12 ENCOUNTER — Telehealth: Payer: Self-pay | Admitting: Family

## 2014-07-12 MED ORDER — HYDROCHLOROTHIAZIDE 25 MG PO TABS: 25.0000 mg | ORAL_TABLET | Freq: Every day | ORAL | Status: DC

## 2014-07-12 NOTE — Telephone Encounter (Signed)
hydrochlorizide she needs aq refill and needs a 90 day as each month having to pick this up is too much trouble

## 2014-07-12 NOTE — Telephone Encounter (Signed)
Refill sent.

## 2014-07-15 ENCOUNTER — Telehealth: Payer: Self-pay | Admitting: Pulmonary Disease

## 2014-07-15 MED ORDER — NICOTINE 10 MG IN INHA: 1.0000 | RESPIRATORY_TRACT | Status: DC | PRN

## 2014-07-15 NOTE — Telephone Encounter (Signed)
Called and spoke to pt. Pt stated she was given the nasal spray instead of the inhaler of the nicotrol. Rx in epic states inhaler. Called and spoke to pharmacist at [facility] and they stated pt can bring back the nasal spray and they will give her the inhaler spray. They are needing a new rx stating a high cartridge number which is indicative that it is the inhaler and not nasal spray. Rx sent to preferred pharmacy. Pt aware of the previous.

## 2014-07-15 NOTE — Telephone Encounter (Signed)
LMTCB

## 2014-07-26 ENCOUNTER — Ambulatory Visit (HOSPITAL_BASED_OUTPATIENT_CLINIC_OR_DEPARTMENT_OTHER)
Admission: RE | Admit: 2014-07-26 | Discharge: 2014-07-26 | Disposition: A | Discharge status: Home / Self Care | Payer: BLUE CROSS/BLUE SHIELD | Source: Ambulatory Visit | Attending: Physician Assistant | Admitting: Physician Assistant

## 2014-07-26 ENCOUNTER — Ambulatory Visit (INDEPENDENT_AMBULATORY_CARE_PROVIDER_SITE_OTHER): Payer: BLUE CROSS/BLUE SHIELD | Admitting: Physician Assistant

## 2014-07-26 ENCOUNTER — Telehealth: Payer: Self-pay | Admitting: Family

## 2014-07-26 ENCOUNTER — Encounter: Payer: Self-pay | Admitting: Physician Assistant

## 2014-07-26 VITALS — BP 114/62 | HR 70 | Temp 98.4°F | Resp 18 | Ht 64.0 in | Wt 250.4 lb

## 2014-07-26 DIAGNOSIS — R110 Nausea: Secondary | ICD-10-CM | POA: Diagnosis not present

## 2014-07-26 DIAGNOSIS — K5900 Constipation, unspecified: Secondary | ICD-10-CM | POA: Insufficient documentation

## 2014-07-26 DIAGNOSIS — K219 Gastro-esophageal reflux disease without esophagitis: Secondary | ICD-10-CM

## 2014-07-26 LAB — CBC
HCT: 44.7 % (ref 36.0–46.0)
Hemoglobin: 14.9 g/dL (ref 12.0–15.0)
MCHC: 33.3 g/dL (ref 30.0–36.0)
MCV: 99.3 fl (ref 78.0–100.0)
PLATELETS: 296 10*3/uL (ref 150.0–400.0)
RBC: 4.5 Mil/uL (ref 3.87–5.11)
RDW: 12.6 % (ref 11.5–15.5)
WBC: 8 10*3/uL (ref 4.0–10.5)

## 2014-07-26 LAB — COMPREHENSIVE METABOLIC PANEL
ALT: 6 U/L (ref 0–35)
AST: 14 U/L (ref 0–37)
Albumin: 3.9 g/dL (ref 3.5–5.2)
Alkaline Phosphatase: 79 U/L (ref 39–117)
BILIRUBIN TOTAL: 0.6 mg/dL (ref 0.2–1.2)
BUN: 23 mg/dL (ref 6–23)
CO2: 31 mEq/L (ref 19–32)
Calcium: 10 mg/dL (ref 8.4–10.5)
Chloride: 101 mEq/L (ref 96–112)
Creatinine, Ser: 0.94 mg/dL (ref 0.40–1.20)
GFR: 65.07 mL/min (ref >60.00)
Glucose, Bld: 94 mg/dL (ref 70–99)
POTASSIUM: 3.4 meq/L — AB (ref 3.5–5.1)
Sodium: 138 mEq/L (ref 135–145)
Total Protein: 7.2 g/dL (ref 6.0–8.3)

## 2014-07-26 LAB — H. PYLORI ANTIBODY, IGG: H Pylori IgG: NEGATIVE

## 2014-07-26 MED ORDER — ONDANSETRON 8 MG PO TBDP: 8.0000 mg | ORAL_TABLET | Freq: Three times a day (TID) | ORAL | Status: DC | PRN

## 2014-07-26 MED ORDER — OMEPRAZOLE 20 MG PO CPDR: 20.0000 mg | DELAYED_RELEASE_CAPSULE | Freq: Every day | ORAL | Status: DC

## 2014-07-26 NOTE — Assessment & Plan Note (Signed)
Seems symptoms may be related to GERD.  Rx Prilosec 20 mg daily.  GERD diet discussed. Will obtain lab workup today. Will also obtain x-ray abdomen. Follow-up 2 weeks.

## 2014-07-26 NOTE — Progress Notes (Signed)
Patient presents to clinic today c/o 3 months of nausea that is prominent upon wakening in the morning.  Denies emesis.  Endorses intermittent diffuse abdominal cramping.  Denies constipation, diarrhea, melena, hematochezia or tenesmus.  Denies fever, chills, malaise.  Endorses intermittent heart burn and reflux, worse when lying down at night.  Has not taken anything for reflux symptoms. Has old Rx for Zofran that she has used to resolve nausea.  Past Medical History  Diagnosis Date  . Arthritis   . Asthma   . Depression   . History of chicken pox   . COPD (chronic obstructive pulmonary disease)   . GERD (gastroesophageal reflux disease)   . Glaucoma   . Allergy   . Urinary incontinence   . History of high blood pressure   . Hyperthyroidism   . H/O sleep apnea   . Legally blind     color blind  . OSA (obstructive sleep apnea) 08/18/2012    Current Outpatient Prescriptions on File Prior to Visit  Medication Sig Dispense Refill  . albuterol (PROVENTIL) (2.5 MG/3ML) 0.083% nebulizer solution Take 3 mLs (2.5 mg total) by nebulization every 6 (six) hours as needed for wheezing. 150 mL 1  . bimatoprost (LUMIGAN) 0.01 % SOLN Place 1 drop into both eyes every evening.    Marland Kitchen BRINTELLIX 20 MG TABS Take 20 mg by mouth daily.    . hydrochlorothiazide (HYDRODIURIL) 25 MG tablet Take 1 tablet (25 mg total) by mouth daily. 90 tablet 1  . nicotine (NICOTROL) 10 MG inhaler Inhale 1 cartridge (1 continuous puffing total) into the lungs as needed for smoking cessation. 168 each 2  . PROAIR HFA 108 (90 BASE) MCG/ACT inhaler TWO PUFFS EVERY 4 HOURS AS NEEDED 25.5 g 0  . SYMBICORT 160-4.5 MCG/ACT inhaler USE 2 PUFFS TWICE DAILY 30.6 g 0  . venlafaxine XR (EFFEXOR-XR) 75 MG 24 hr capsule Take 150 mg by mouth at bedtime.      No current facility-administered medications on file prior to visit.    Allergies  Allergen Reactions  . Shellfish Allergy Shortness Of Breath and Swelling  . Nitrofurantoin       Other reaction(s): Nausea And Vomiting    Family History  Problem Relation Age of Onset  . Arthritis Mother   . Hypertension Mother   . Arthritis Father   . Prostate cancer Father   . Hyperlipidemia Father   . Heart disease Father   . Diabetes Maternal Aunt   . Diabetes Maternal Grandmother   . Hypertension Maternal Grandmother   . Kidney disease Maternal Grandmother   . Heart disease Maternal Grandfather   . Heart disease Paternal Grandfather   . Breast cancer Paternal Grandmother   . Crohn's disease Cousin     1st  . Colon cancer Neg Hx     History   Social History  . Marital Status: Divorced    Spouse Name: N/A    Number of Children: 1  . Years of Education: N/A   Occupational History  . 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     Social History Main Topics  . Smoking status: Current Some Day Smoker -- 2.00 packs/day for 40 years    Types: Cigarettes    Last Attempt to Quit: 10/01/2012  . Smokeless tobacco: Never Used     Comment: Started smoking at age 54.  Currently using ecig.  Will smoke cigeratte occas.    . Alcohol Use: No  . Drug Use: No  .  Sexual Activity: None   Other Topics Concern  . None   Social History Narrative   Works as a Social research officer, government   Pt is single- lives alone   Daughter lives in [place]   Completed 12th grade   Enjoys bowling, blind/visually impaired friends, gambling- internet slots.     Review of Systems - See HPI.  All other ROS are negative.  BP 114/62 mmHg  Pulse 70  Temp(Src) 98.4 F (36.9 C) (Oral)  Resp 18  Ht 5\' 4"  (1.626 m)  Wt 250 lb 6 oz (113.569 kg)  BMI 42.96 kg/m2  SpO2 97%  LMP 07/09/2008  Physical Exam  Constitutional: She is oriented to person, place, and time and well-developed, well-nourished, and in no distress.  HENT:  Head: Normocephalic and atraumatic.  Eyes: Conjunctivae are normal. Pupils are equal, round, and reactive to light.  Neck: Neck supple.  Cardiovascular: Normal  rate, regular rhythm, normal heart sounds and intact distal pulses.   Pulmonary/Chest: Effort normal and breath sounds normal. No respiratory distress. She has no wheezes. She has no rales. She exhibits no tenderness.  Abdominal: Soft. Bowel sounds are normal. She exhibits no distension and no mass. There is no tenderness. There is no rebound and no guarding.  Neurological: She is alert and oriented to person, place, and time.  Skin: Skin is warm and dry. No rash noted.  Psychiatric: Affect normal.  Vitals reviewed.  Assessment/Plan: GERD (gastroesophageal reflux disease) Seems symptoms may be related to GERD.  Rx Prilosec 20 mg daily.  GERD diet discussed. Will obtain lab workup today. Will also obtain x-ray abdomen. Follow-up 2 weeks.

## 2014-07-26 NOTE — Telephone Encounter (Signed)
emmi mailed  °

## 2014-07-26 NOTE — Patient Instructions (Signed)
Please take Prilosec daily as directed.  Read information below on acid reflux diet.  Continue the zofran. Increase fluids.    Don't forget to stop by the lab for blood work before going downstairs for x-ray.  Follow-up in 2 weeks.  Food Choices for Gastroesophageal Reflux Disease When you have gastroesophageal reflux disease (GERD), the foods you eat and your eating habits are very important. Choosing the right foods can help ease the discomfort of GERD. WHAT GENERAL GUIDELINES DO I NEED TO FOLLOW?  Choose fruits, vegetables, whole grains, low-fat dairy products, and low-fat meat, fish, and poultry.  Limit fats such as oils, salad dressings, butter, nuts, and avocado.  Keep a food diary to identify foods that cause symptoms.  Avoid foods that cause reflux. These may be different for different people.  Eat frequent small meals instead of three large meals each day.  Eat your meals slowly, in a relaxed setting.  Limit fried foods.  Cook foods using methods other than frying.  Avoid drinking alcohol.  Avoid drinking large amounts of liquids with your meals.  Avoid bending over or lying down until 2-3 hours after eating. WHAT FOODS ARE NOT RECOMMENDED? The following are some foods and drinks that may worsen your symptoms: Vegetables Tomatoes. Tomato juice. Tomato and spaghetti sauce. Chili peppers. Onion and garlic. Horseradish. Fruits Oranges, grapefruit, and lemon (fruit and juice). Meats High-fat meats, fish, and poultry. This includes hot dogs, ribs, ham, sausage, salami, and bacon. Dairy Whole milk and chocolate milk. Sour cream. Cream. Butter. Ice cream. Cream cheese.  Beverages Coffee and tea, with or without caffeine. Carbonated beverages or energy drinks. Condiments Hot sauce. Barbecue sauce.  Sweets/Desserts Chocolate and cocoa. Donuts. Peppermint and spearmint. Fats and Oils High-fat foods, including [place] fries and potato chips. Other Vinegar. Strong  spices, such as black pepper, white pepper, red pepper, cayenne, curry powder, cloves, ginger, and chili powder. The items listed above may not be a complete list of foods and beverages to avoid. Contact your dietitian for more information. Document Released: 06/25/2005 Document Revised: 06/30/2013 Document Reviewed: 04/29/2013 [facility] Patient Information 2015 [place], [place]. This information is not intended to replace advice given to you by your health care provider. Make sure you discuss any questions you have with your health care provider.

## 2014-07-26 NOTE — Progress Notes (Signed)
Pre visit review using our clinic review tool, if applicable. No additional management support is needed unless otherwise documented below in the visit note/SLS  

## 2014-08-09 ENCOUNTER — Encounter: Payer: Self-pay | Admitting: Family

## 2014-08-09 ENCOUNTER — Ambulatory Visit (INDEPENDENT_AMBULATORY_CARE_PROVIDER_SITE_OTHER): Payer: BLUE CROSS/BLUE SHIELD | Admitting: Family

## 2014-08-09 VITALS — BP 118/80 | HR 73 | Temp 98.0°F | Resp 16 | Ht 66.5 in | Wt 252.0 lb

## 2014-08-09 DIAGNOSIS — K219 Gastro-esophageal reflux disease without esophagitis: Secondary | ICD-10-CM

## 2014-08-09 MED ORDER — OMEPRAZOLE 20 MG PO CPDR: 20.0000 mg | DELAYED_RELEASE_CAPSULE | Freq: Every day | ORAL | Status: DC

## 2014-08-09 MED ORDER — ONDANSETRON HCL 8 MG PO TABS: 8.0000 mg | ORAL_TABLET | Freq: Three times a day (TID) | ORAL | Status: DC | PRN

## 2014-08-09 MED ORDER — HYDROCHLOROTHIAZIDE 25 MG PO TABS: 25.0000 mg | ORAL_TABLET | Freq: Every day | ORAL | Status: DC

## 2014-08-09 NOTE — Assessment & Plan Note (Addendum)
Nausea may be caused by taking meds on an empty stomach. Instructed to take meds with food and take prilosec daily. Call if nausea symptoms worsen or if they do not improve. Follow up in 3 months.

## 2014-08-09 NOTE — Progress Notes (Signed)
Pre visit review using our clinic review tool, if applicable. No additional management support is needed unless otherwise documented below in the visit note. 

## 2014-08-09 NOTE — Patient Instructions (Signed)
Take your evening meds with food. Take prilosec daily. Call if nausea symptoms worsen or if they do not improve. Follow up in 3 months.

## 2014-08-09 NOTE — Progress Notes (Signed)
Subjective:    Patient ID: Audrey Lewis, female    DOB: November 01, 1955, 59 y.o.   MRN: [id number]  HPI Audrey Lewis is here for follow up for GI sx. She was seen 2 weeks ago for nausea, cramping, heartburn, and reflux which may be related to GERD. Xray of abdomen was negative for obstruction.She was started on omeprazole 20 mg daily. Did not take any medications on Saturday night. No nausea the next morning. Took meds yesterday evening and developed nausea 20-30 minutes later.Reports not taking meds with food. She has not been remembering to take omeprazole daily.    Review of Systems See HPI  Past Medical History  Diagnosis Date  . Arthritis   . Asthma   . Depression   . History of chicken pox   . COPD (chronic obstructive pulmonary disease)   . GERD (gastroesophageal reflux disease)   . Glaucoma   . Allergy   . Urinary incontinence   . History of high blood pressure   . Hyperthyroidism   . H/O sleep apnea   . Legally blind     color blind  . OSA (obstructive sleep apnea) 08/18/2012    History   Social History  . Marital Status: Divorced    Spouse Name: N/A    Number of Children: 1  . Years of Education: N/A   Occupational History  . 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     Social History Main Topics  . Smoking status: Current Some Day Smoker -- 2.00 packs/day for 40 years    Types: Cigarettes    Last Attempt to Quit: 10/01/2012  . Smokeless tobacco: Never Used     Comment: Started smoking at age 4.  Currently using ecig.  Will smoke cigeratte occas.    . Alcohol Use: No  . Drug Use: No  . Sexual Activity: Not on file   Other Topics Concern  . Not on file   Social History Narrative   Works as a Social research officer, government   Pt is single- lives alone   Daughter lives in [place]   Completed 12th grade   Enjoys bowling, blind/visually impaired friends, gambling- internet slots.      Past Surgical History  Procedure Laterality Date  . Carpal tunnel release  Bilateral   . Rotator cuff repair Right 2000  . Bariatric surgery  2011  . Joint replacement Left 2012    left knee  . Tubal ligation  1983  . Breast reduction surgery    . Cholecystectomy      with hernia repair    Family History  Problem Relation Age of Onset  . Arthritis Mother   . Hypertension Mother   . Arthritis Father   . Prostate cancer Father   . Hyperlipidemia Father   . Heart disease Father   . Diabetes Maternal Aunt   . Diabetes Maternal Grandmother   . Hypertension Maternal Grandmother   . Kidney disease Maternal Grandmother   . Heart disease Maternal Grandfather   . Heart disease Paternal Grandfather   . Breast cancer Paternal Grandmother   . Crohn's disease Cousin     1st  . Colon cancer Neg Hx     Allergies  Allergen Reactions  . Shellfish Allergy Shortness Of Breath and Swelling  . Nitrofurantoin     Other reaction(s): Nausea And Vomiting    Current Outpatient Prescriptions on File Prior to Visit  Medication Sig Dispense Refill  . albuterol (PROVENTIL) (2.5 MG/3ML) 0.083%  nebulizer solution Take 3 mLs (2.5 mg total) by nebulization every 6 (six) hours as needed for wheezing. 150 mL 1  . bimatoprost (LUMIGAN) 0.01 % SOLN Place 1 drop into both eyes every evening.    Marland Kitchen BRINTELLIX 20 MG TABS Take 20 mg by mouth daily.    . nicotine (NICOTROL) 10 MG inhaler Inhale 1 cartridge (1 continuous puffing total) into the lungs as needed for smoking cessation. 168 each 2  . PROAIR HFA 108 (90 BASE) MCG/ACT inhaler TWO PUFFS EVERY 4 HOURS AS NEEDED 25.5 g 0  . promethazine (PHENERGAN) 25 MG tablet Take 25 mg by mouth every 6 (six) hours as needed for nausea or vomiting.    . SYMBICORT 160-4.5 MCG/ACT inhaler USE 2 PUFFS TWICE DAILY 30.6 g 0  . venlafaxine XR (EFFEXOR-XR) 75 MG 24 hr capsule Take 150 mg by mouth at bedtime.      No current facility-administered medications on file prior to visit.    BP 118/80 mmHg  Pulse 73  Temp(Src) 98 F (36.7 C) (Oral)   Resp 16  Ht 5' 6.5" (1.689 m)  Wt 252 lb (114.306 kg)  BMI 40.07 kg/m2  SpO2 96%  LMP 07/09/2008       Objective:   Physical Exam  Constitutional: She is oriented to person, place, and time. No distress.  HENT:  Head: Normocephalic and atraumatic.  Cardiovascular: Normal rate, regular rhythm and normal heart sounds.   Pulmonary/Chest: Effort normal and breath sounds normal. No respiratory distress. She has no wheezes. She has no rales. She exhibits no tenderness.  Abdominal: Soft. Bowel sounds are normal. She exhibits no distension and no mass. There is no tenderness. There is no rebound and no guarding.  Neurological: She is alert and oriented to person, place, and time.  Skin: Skin is warm and dry. She is not diaphoretic.  Some ulcerated lesions on right forearm from pt "picking scabs"  Psychiatric: She has a normal mood and affect. Her behavior is normal. Judgment and thought content normal.          Assessment & Plan:   Patient seen along with [facility] NP-student.  I have personally seen and examined patient and agree with Ms. Whitmire's assessment and plan- Debbrah Alar NP

## 2014-08-23 ENCOUNTER — Ambulatory Visit (INDEPENDENT_AMBULATORY_CARE_PROVIDER_SITE_OTHER): Payer: BLUE CROSS/BLUE SHIELD | Admitting: Family Medicine

## 2014-08-23 ENCOUNTER — Encounter: Payer: Self-pay | Admitting: Family Medicine

## 2014-08-23 VITALS — BP 108/74 | HR 72 | Ht 64.0 in | Wt 249.0 lb

## 2014-08-23 DIAGNOSIS — M150 Primary generalized (osteo)arthritis: Secondary | ICD-10-CM

## 2014-08-23 DIAGNOSIS — M159 Polyosteoarthritis, unspecified: Secondary | ICD-10-CM

## 2014-08-23 MED ORDER — PREDNISONE (PAK) 10 MG PO TABS: ORAL_TABLET | ORAL | Status: DC

## 2014-08-23 NOTE — Patient Instructions (Addendum)
Your multi-joint pain is due to arthritis. Take prednisone as directed x 6 days. Day after finishing prednisone you could try aleve 2 tabs twice a day with food OR ibuprofen 600mg  three times a day with food. Take tylenol 500mg  1-2 tabs three times a day for pain. Glucosamine sulfate 750mg  twice a day is a supplement that may help. Capsaicin topically up to four times a day may also help with pain. Cortisone injections are an option for your knee and thumb. If cortisone injections do not help, there are different types of shots that may help but they take longer to take effect for your knee. It's important that you continue to stay active. If you are overweight, try to lose weight through diet and exercise. Straight leg raises, knee extensions 3 sets of 10 once a day (add ankle weight if these become too easy). Shoulder strengthening exercises as shown. Consider physical therapy to strengthen muscles around the joint that hurts to take pressure off of the joint itself. Shoe inserts with good arch support may be helpful. Walker or cane if needed. Heat or ice 15 minutes at a time 3-4 times a day as needed to help with pain. Call me if you want to try cortisone shots or the physical therapy.

## 2014-08-25 NOTE — Assessment & Plan Note (Signed)
pain primarily consistent with this diagnosis though we did not examine hip today and she also has some elements cervical strain and rotator cuff impingement.  She would like to try prednisone dose pack - switch to nsaids following this.  Tylenol, glucosamine, other topical medications discussed.  Advised we do not prescribe narcotics for this condition.  Offered injections of 1st CMC, knee which she declined at this time.  Offered physical therapy - will consider in future.  Heat or ice if needed.  Call if she would like to proceed with injections or PT.

## 2014-08-25 NOTE — Progress Notes (Signed)
PCP: Nance Pear., NP  Subjective:   HPI: Patient is a 59 y.o. female here for multiple complaints.  Patient denies known injury. States for the past month she has had worsening left shoulder, neck, left hip, right knee, and right base of thumb pain. No history of gout or rheumatoid arthritis. No warmth or swelling of these areas except right knee swelling. States they are all 10/10 level of pain. States she has 'tried everything' - named tylenol, biofreeze, ibuprofen, heat. She states vicodin worked well and she would like a prescription for this.  Past Medical History  Diagnosis Date  . Arthritis   . Asthma   . Depression   . History of chicken pox   . COPD (chronic obstructive pulmonary disease)   . GERD (gastroesophageal reflux disease)   . Glaucoma   . Allergy   . Urinary incontinence   . History of high blood pressure   . Hyperthyroidism   . H/O sleep apnea   . Legally blind     color blind  . OSA (obstructive sleep apnea) 08/18/2012    Current Outpatient Prescriptions on File Prior to Visit  Medication Sig Dispense Refill  . albuterol (PROVENTIL) (2.5 MG/3ML) 0.083% nebulizer solution Take 3 mLs (2.5 mg total) by nebulization every 6 (six) hours as needed for wheezing. 150 mL 1  . bimatoprost (LUMIGAN) 0.01 % SOLN Place 1 drop into both eyes every evening.    Marland Kitchen BRINTELLIX 20 MG TABS Take 20 mg by mouth daily.    . hydrochlorothiazide (HYDRODIURIL) 25 MG tablet Take 1 tablet (25 mg total) by mouth daily. 90 tablet 1  . nicotine (NICOTROL) 10 MG inhaler Inhale 1 cartridge (1 continuous puffing total) into the lungs as needed for smoking cessation. 168 each 2  . omeprazole (PRILOSEC) 20 MG capsule Take 1 capsule (20 mg total) by mouth daily. 90 capsule 1  . ondansetron (ZOFRAN) 8 MG tablet Take 1 tablet (8 mg total) by mouth every 8 (eight) hours as needed for nausea or vomiting. 30 tablet 0  . PROAIR HFA 108 (90 BASE) MCG/ACT inhaler TWO PUFFS EVERY 4 HOURS AS  NEEDED 25.5 g 0  . promethazine (PHENERGAN) 25 MG tablet Take 25 mg by mouth every 6 (six) hours as needed for nausea or vomiting.    . SYMBICORT 160-4.5 MCG/ACT inhaler USE 2 PUFFS TWICE DAILY 30.6 g 0  . venlafaxine XR (EFFEXOR-XR) 75 MG 24 hr capsule Take 150 mg by mouth at bedtime.      No current facility-administered medications on file prior to visit.    Past Surgical History  Procedure Laterality Date  . Carpal tunnel release Bilateral   . Rotator cuff repair Right 2000  . Bariatric surgery  2011  . Joint replacement Left 2012    left knee  . Tubal ligation  1983  . Breast reduction surgery    . Cholecystectomy      with hernia repair    Allergies  Allergen Reactions  . Shellfish Allergy Shortness Of Breath and Swelling  . Nitrofurantoin     Other reaction(s): Nausea And Vomiting    History   Social History  . Marital Status: Divorced    Spouse Name: N/A  . Number of Children: 1  . Years of Education: N/A   Occupational History  . 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     Social History Main Topics  . Smoking status: Current Some Day Smoker -- 2.00 packs/day for 40 years  Types: Cigarettes    Last Attempt to Quit: 10/01/2012  . Smokeless tobacco: Never Used     Comment: Started smoking at age 41.  Currently using ecig.  Will smoke cigeratte occas.    . Alcohol Use: No  . Drug Use: No  . Sexual Activity: Not on file   Other Topics Concern  . Not on file   Social History Narrative   Works as a Social research officer, government   Pt is single- lives alone   Daughter lives in [place]   Completed 12th grade   Enjoys bowling, blind/visually impaired friends, gambling- internet slots.      Family History  Problem Relation Age of Onset  . Arthritis Mother   . Hypertension Mother   . Arthritis Father   . Prostate cancer Father   . Hyperlipidemia Father   . Heart disease Father   . Diabetes Maternal Aunt   . Diabetes Maternal Grandmother   .  Hypertension Maternal Grandmother   . Kidney disease Maternal Grandmother   . Heart disease Maternal Grandfather   . Heart disease Paternal Grandfather   . Breast cancer Paternal Grandmother   . Crohn's disease Cousin     1st  . Colon cancer Neg Hx     BP 108/74 mmHg  Pulse 72  Ht 5\' 4"  (1.626 m)  Wt 249 lb (112.946 kg)  BMI 42.72 kg/m2  LMP 07/09/2008  Review of Systems: See HPI above.    Objective:  Physical Exam:  Gen: NAD  Neck: No gross deformity, swelling, bruising. TTP L > R paraspinal regions.  No midline/bony TTP. FROM neck - pain with flexion mainly. BUE strength 5/5.   Sensation intact to light touch.   NV intact distal BUEs.  L shoulder: No swelling, ecchymoses.  No gross deformity. No TTP. Full IR/ER - painful and only to 120 degrees of flexion, 110 degrees abduction - limited by pain. Positive Hawkins, Neers. Strength 5/5 with empty can and resisted internal/external rotation. Negative apprehension. NV intact distally.  R knee: No gross deformity, ecchymoses, swelling.  2+ Crepitation. TTP medial > lateral joint lines. FROM. Negative ant/post drawers. Negative valgus/varus testing. Negative lachmanns. Negative mcmurrays, apleys, patellar apprehension. NV intact distally.  R hand: No gross deformity, swelling, bruising. TTP 1st [facility] reproducing her pain.  No tenderness carpal tunnel or 1st dorsal compartment. Pain at 1st [facility] with finkelsteins. NVI distally. Negative tinels at carpal tunnel  Assessment & Plan:  1. Osteoarthritis multiple sites - pain primarily consistent with this diagnosis though we did not examine hip today and she also has some elements cervical strain and rotator cuff impingement.  She would like to try prednisone dose pack - switch to nsaids following this.  Tylenol, glucosamine, other topical medications discussed.  Advised we do not prescribe narcotics for this condition.  Offered injections of 1st [facility], knee which she  declined at this time.  Offered physical therapy - will consider in future.  Heat or ice if needed.  Call if she would like to proceed with injections or PT.

## 2014-09-01 ENCOUNTER — Encounter: Payer: Self-pay | Admitting: Pulmonary Disease

## 2014-09-01 ENCOUNTER — Ambulatory Visit (INDEPENDENT_AMBULATORY_CARE_PROVIDER_SITE_OTHER): Payer: BLUE CROSS/BLUE SHIELD | Admitting: Pulmonary Disease

## 2014-09-01 VITALS — BP 98/72 | HR 73 | Temp 98.2°F | Ht 64.0 in | Wt 258.0 lb

## 2014-09-01 DIAGNOSIS — G4733 Obstructive sleep apnea (adult) (pediatric): Secondary | ICD-10-CM

## 2014-09-01 DIAGNOSIS — J42 Unspecified chronic bronchitis: Secondary | ICD-10-CM

## 2014-09-01 DIAGNOSIS — F17200 Nicotine dependence, unspecified, uncomplicated: Secondary | ICD-10-CM

## 2014-09-01 DIAGNOSIS — Z720 Tobacco use: Secondary | ICD-10-CM

## 2014-09-01 LAB — PULMONARY FUNCTION TEST
DL/VA % PRED: 93 %
DL/VA: 4.51 ml/min/mmHg/L
DLCO unc % pred: 90 %
DLCO unc: 21.91 ml/min/mmHg
FEF 25-75 PRE: 0.7 L/s
FEF 25-75 Post: 1.47 L/sec
FEF2575-%Change-Post: 108 %
FEF2575-%Pred-Post: 60 %
FEF2575-%Pred-Pre: 28 %
FEV1-%Change-Post: 25 %
FEV1-%Pred-Post: 62 %
FEV1-%Pred-Pre: 49 %
FEV1-PRE: 1.3 L
FEV1-Post: 1.63 L
FEV1FVC-%Change-Post: -4 %
FEV1FVC-%Pred-Pre: 81 %
FEV6-%Change-Post: 31 %
FEV6-%PRED-POST: 79 %
FEV6-%Pred-Pre: 60 %
FEV6-POST: 2.6 L
FEV6-Pre: 1.98 L
FEV6FVC-%CHANGE-POST: 0 %
FEV6FVC-%PRED-PRE: 101 %
FEV6FVC-%Pred-Post: 101 %
FVC-%Change-Post: 31 %
FVC-%PRED-PRE: 60 %
FVC-%Pred-Post: 78 %
FVC-POST: 2.65 L
FVC-Pre: 2.02 L
PRE FEV1/FVC RATIO: 64 %
Post FEV1/FVC ratio: 62 %
Post FEV6/FVC ratio: 98 %
Pre FEV6/FVC Ratio: 99 %
RV % pred: 183 %
RV: 3.57 L
TLC % pred: 118 %
TLC: 6 L

## 2014-09-01 MED ORDER — TIOTROPIUM BROMIDE MONOHYDRATE 18 MCG IN CAPS: 18.0000 ug | ORAL_CAPSULE | Freq: Every day | RESPIRATORY_TRACT | Status: DC

## 2014-09-01 MED ORDER — BUPROPION HCL ER (XL) 150 MG PO TB24: 150.0000 mg | ORAL_TABLET | Freq: Two times a day (BID) | ORAL | Status: DC

## 2014-09-01 NOTE — Assessment & Plan Note (Signed)
Wellbutrin 150 twice daily x 3 days , then once daily Set a QUIT date

## 2014-09-01 NOTE — Assessment & Plan Note (Signed)
Trial of spiriva - sample - once daily Use symbicort twice daily

## 2014-09-01 NOTE — Patient Instructions (Signed)
Trial of spiriva - sample - once daily Use symbicort twice daily Wellbutrin 150 twice daily x 3 days , then once daily Set a QUIT date

## 2014-09-01 NOTE — Progress Notes (Signed)
   Subjective:    Patient ID: Audrey Lewis, female    DOB: 1955/11/09, 59 y.o.   MRN: [id number]  HPI  59 yo smoker with COPD  & OSA  She was diagnosed with OSA , many yrs ago & placed on a CPAP with full face mask, lost this when she moved. She underwent bariatric surgery 2010 & lost from 330 to lowest 190 lbs but has regained to 251 now. 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    09/01/2014  Chief Complaint  Patient presents with  . Follow-up    Pt here f/u PFT/ here breathing unchanged; pt still smoking, she has not had sleep study done yet.    07/08/2014 Acute OV >> augmentin/pred - given Zpakearlier Remains on Symbicort.    Wants to quit smoking . Patches did not work., unable to take Chantix d/t more depression. Tried Nicotrol inhaler. Smoked 1-2 PPD , has cut down to 1 PPD.   Was supposed to have a sleep study. However, insurance Would not cover and she could not afford the high deductible PFT 09/01/14 >> FEV1 49% , improved to 62% with albuterol, air trapping +, DLCO nml  Review of Systems neg for any significant sore throat, dysphagia, itching, sneezing, nasal congestion or excess/ purulent secretions, fever, chills, sweats, unintended wt loss, pleuritic or exertional cp, hempoptysis, orthopnea pnd or change in chronic leg swelling. Also denies presyncope, palpitations, heartburn, abdominal pain, nausea, vomiting, diarrhea or change in bowel or urinary habits, dysuria,hematuria, rash, arthralgias, visual complaints, headache, numbness weakness or ataxia.     Objective:   Physical Exam  Gen. Pleasant, obese, in no distress ENT - no lesions, no post nasal drip Neck: No JVD, no thyromegaly, no carotid bruits Lungs: no use of accessory muscles, no dullness to percussion, decreased without rales or rhonchi  Cardiovascular: Rhythm regular, heart  sounds  normal, no murmurs or gallops, no peripheral edema Musculoskeletal: No deformities, no cyanosis or clubbing , no tremors       Assessment & Plan:

## 2014-09-01 NOTE — Assessment & Plan Note (Signed)
Deferred PSG for now She will try at year end once met deductible. Can do Home study instead

## 2014-09-01 NOTE — Progress Notes (Signed)
PFT done today. 

## 2014-09-02 ENCOUNTER — Telehealth: Payer: Self-pay | Admitting: Pulmonary Disease

## 2014-09-02 MED ORDER — BUPROPION HCL ER (XL) 150 MG PO TB24: 150.0000 mg | ORAL_TABLET | Freq: Two times a day (BID) | ORAL | Status: DC

## 2014-09-02 MED ORDER — TIOTROPIUM BROMIDE MONOHYDRATE 18 MCG IN CAPS: 18.0000 ug | ORAL_CAPSULE | Freq: Every day | RESPIRATORY_TRACT | Status: DC

## 2014-09-02 NOTE — Telephone Encounter (Signed)
Rx's have been sent to correct pharmacy. Pt is aware. Nothing further was needed.

## 2014-09-13 ENCOUNTER — Telehealth: Payer: Self-pay | Admitting: Family

## 2014-09-13 MED ORDER — ONDANSETRON HCL 8 MG PO TABS: 8.0000 mg | ORAL_TABLET | Freq: Three times a day (TID) | ORAL | Status: DC | PRN

## 2014-09-13 NOTE — Telephone Encounter (Signed)
Caller name: Anivea Relation to pt: self Call back number: [phone] Pharmacy: [facility]  Reason for call:   Requesting zofran refill. Not chewable.

## 2014-10-01 ENCOUNTER — Other Ambulatory Visit: Payer: Self-pay | Admitting: Pulmonary Disease

## 2014-10-01 ENCOUNTER — Other Ambulatory Visit: Payer: Self-pay | Admitting: Family

## 2014-10-25 ENCOUNTER — Ambulatory Visit: Payer: BC Managed Care – PPO | Admitting: Family

## 2014-11-30 ENCOUNTER — Telehealth: Payer: Self-pay | Admitting: Pulmonary Disease

## 2014-11-30 DIAGNOSIS — J449 Chronic obstructive pulmonary disease, unspecified: Secondary | ICD-10-CM

## 2014-11-30 NOTE — Telephone Encounter (Signed)
Per patient, she would like referral placed to [facility].  Referral entered.  Patient notified. Nothing further needed.

## 2014-11-30 NOTE — Telephone Encounter (Signed)
Spoke with pt. She has moved to [facility]. Would prefer to be referred to a practice there instead of coming here.  RA - please advise. Thanks.

## 2014-11-30 NOTE — Telephone Encounter (Signed)
Ok to refer to [facility] or [facility]

## 2014-12-02 ENCOUNTER — Ambulatory Visit: Payer: BLUE CROSS/BLUE SHIELD | Admitting: Adult Health

## 2014-12-27 ENCOUNTER — Telehealth: Payer: Self-pay | Admitting: Pulmonary Disease

## 2014-12-27 NOTE — Telephone Encounter (Signed)
Number given to reach Audrey Lewis back is incorrect/diconnected.  Correct # is [phone] Spoke with Audrey Lewis with [facility], requesting recent PFTs 09/02/14 be faxed to their office.  These have been faxed to [phone] Nothing further needed.

## 2015-10-02 ENCOUNTER — Other Ambulatory Visit: Payer: Self-pay | Admitting: Pulmonary Disease

## 2016-08-31 ENCOUNTER — Encounter: Payer: Self-pay | Admitting: Internal Medicine

## 2016-08-31 ENCOUNTER — Ambulatory Visit (INDEPENDENT_AMBULATORY_CARE_PROVIDER_SITE_OTHER): Payer: BLUE CROSS/BLUE SHIELD | Admitting: Internal Medicine

## 2016-08-31 VITALS — BP 124/88 | HR 73 | Wt 230.0 lb

## 2016-08-31 DIAGNOSIS — Z8639 Personal history of other endocrine, nutritional and metabolic disease: Secondary | ICD-10-CM | POA: Diagnosis not present

## 2016-08-31 LAB — T4, FREE: Free T4: 0.82 ng/dL (ref 0.60–1.60)

## 2016-08-31 LAB — TSH: TSH: 0.69 u[IU]/mL (ref 0.35–4.50)

## 2016-08-31 LAB — T3, FREE: T3, Free: 3.4 pg/mL (ref 2.3–4.2)

## 2016-08-31 NOTE — Patient Instructions (Addendum)
Please stop at the lab.  I will let you know about the results of the ultrasound.  Please return in 2 years.

## 2016-08-31 NOTE — Progress Notes (Addendum)
Subjective:     Patient ID: Audrey Lewis, female   DOB: 1955-09-02, 61 y.o.   MRN: [id number]  HPI Audrey Lewis is a 61 y.o. 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 Last visit >3 years ago, in 10/2012.  Reviewed and addended history: Pt was dx with hyperthyroidism in 02/2010 by Dr. Teresita Madura. She was started on MMI. Patient's TSH (08/18/2012) was 0.726, previously, 5.43 (07/23/2012) with a free T3 of 3.4 (2.2-4.2) and a free T4 of 0.81 (0.8-1.8) on Tapazole, but she was taken off Tapazole  the same day.   Pt had a thyroid ultrasound (07/24/2012) done for thyromegaly and history of hyperthyroidism, which showed a multinodular goiter, a diffusely enlarged gland and multiple nodules meeting the criteria for biopsy. I reviewed the images at that time and it appeared that the thyroid gland is replaced by nodules.  I saw her for an initial visit on 08/25/2012 and ordered an Uptake and scan, which returned abnormal: 46.6% uptake at 24h >> pt had RAI ablation on 09/05/2012 (31 mCi).   Patient was then lost for follow-up.  Reviewed labs - last 2 sets brought by pt today: 04/26/2016: TSH 0.495, TT4 5.3 10/28/2015: TSH 0.618 09/29/2013: TSH 1.080 (0.450-4.5)  05/26/2013: TSH 0.395 (0.450-4.5)  TSH was before her RAI ablation: Lab Results  Component Value Date   TSH 0.838 01/15/2014   FREET4 1.04 01/15/2014   She recently had a neck MRI >> a thyroid nodule was seen >> she had a new thyroid U/S (08/20/2016, at [facility]) that showed decrease in size of her nodules:  .Thyroid: The thyroid gland is diffusely nodular with the majority of nodules isoechoic. .The right lobe measures 5.1 x 2.4 x 2.7 cm. .The left lobe measures 5.8 x 3.0 x 3.1 cm. .The isthmus measures 0.8 cm. .Vascular flow: Normal. .Soft tissues of neck: No enlarged lymph nodes nodes or extrathyroidal masses. .Nodules: The 4 largest nodules are as follows:  .1.5 x 1.7 x 1.1 cm nodule in the superior  aspect right lobe is predominantly solid (2) and hyperechoic (1). The lesion is wider-than-tall (0) in shape with smooth margins (0). No suspicious echogenic foci (0)are noted. Total = 3 points, TI-RADS 3 - Mildly suspicious. Ultrsound followup recommended.  Marland Kitchen1.6 x 1.1 x 0.9 cm nodule in the posterior aspect of the superior right lobe is predominantly solid (2) and hypoechoic (2). The lesion is wider-than-tall (0) in shape with ill-defined margins (0). Large comet-tail artifacts (0)are noted. Total = 4 points, TI-RADS 4. Moderately suspicious. Recommend FNA if not already performed   .2.2 x 1.6 x 1.5 cm nodule in the superior aspect left lobe is solid (2) and isoechoic (1). The lesion is wider-than-tall (0) in shape with smooth margins (0). Macrocalcifications (1)are noted. Total = 4 points, TI-RADS 4 - Moderately suspicious. Recommend FNA if not already performed.  .1.8 x 1.7 x 1.6 cm nodule in the mid left thyroid lobeis predominantly solid (2) and isoechoic (1). The lesion is rounded (0) in shape with smooth margins (0). Punctate echogenic foci (3)are noted. Total = 6 points, TI-RADS 4 - Moderately suspicious. Recommend FNA if not already performed.  She c/o: - + weight loss - 40 lbs in last 6 mo - + depression (no SI/HI). She is on Trintellix, Effexor and Xanax. She saw a psychiatrist years ago.  - + hot flashes (not new) - no palpitations - + tremors  (not new)  She does not feel the nodules in the in  her throat. No dysphagia. No shortness of breath.   No Biotin. No steroids recently. No contrast studies.  She has FH of thyroid ds in mother and M aunt.   The patient is legally blind, has hypertension, OSA, glaucoma, GERD, COPD-sees Dr. Joya Gaskins, urinary incontinence, arthritis, history of bariatric surgery in 2011 (lost 120 lbs).   Review of Systems Constitutional: + see HPI Eyes: no blurry vision, no xerophthalmia ENT: no sore throat, no nodules palpated in throat, no  dysphagia/no odynophagia, no hoarseness Cardiovascular: no CP/SOB/palpitations/leg swelling Respiratory: no cough/SOB/wheezing Gastrointestinal: no N/V/D/C Musculoskeletal: no muscle/knee pain and swelling  Skin: no rashes;  Neurological: no tremors/numbness/tingling/dizziness  Past Medical History:  Diagnosis Date  . Allergy   . Arthritis   . Asthma   . COPD (chronic obstructive pulmonary disease)   . Depression   . GERD (gastroesophageal reflux disease)   . Glaucoma   . H/O sleep apnea   . History of chicken pox   . History of high blood pressure   . Hyperthyroidism   . Legally blind    color blind  . OSA (obstructive sleep apnea) 08/18/2012  . Urinary incontinence    Past Surgical History:  Procedure Laterality Date  . [place] SURGERY  2011  . BREAST REDUCTION SURGERY    . CARPAL TUNNEL RELEASE Bilateral   . CHOLECYSTECTOMY     with hernia repair  . JOINT REPLACEMENT Left 2012   left knee  . ROTATOR CUFF REPAIR Right 2000  . TUBAL LIGATION  1983   Social History   Social History  . Marital status: Divorced    Spouse name: N/A  . Number of children: 1  . Years of education: N/A   Occupational History  . 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  [employer]   Social History Main Topics  . Smoking status: Current Some Day Smoker    Packs/day: 2.00    Years: 40.00    Types: Cigarettes    Last attempt to quit: 10/01/2012  . Smokeless tobacco: Never Used     Comment: Started smoking at age 57.  Currently using ecig.  Will smoke cigeratte occas.    . Alcohol use No  . Drug use: No  . Sexual activity: Not on file   Other Topics Concern  . Not on file   Social History Narrative   Works as a Social research officer, government   Pt is single- lives alone   Daughter lives in [place]   Completed 12th grade   Enjoys bowling, blind/visually impaired friends, gambling- internet slots.     Current Outpatient Prescriptions on File Prior to Visit   Medication Sig Dispense Refill  . albuterol (PROVENTIL) (2.5 MG/3ML) 0.083% nebulizer solution Take 3 mLs (2.5 mg total) by nebulization every 6 (six) hours as needed for wheezing. 150 mL 1  . bimatoprost (LUMIGAN) 0.01 % SOLN Place 1 drop into both eyes every evening.    Marland Kitchen BRINTELLIX 20 MG TABS Take 20 mg by mouth daily.    . hydrochlorothiazide (HYDRODIURIL) 25 MG tablet Take 1 tablet (25 mg total) by mouth daily. 90 tablet 1  . ondansetron (ZOFRAN) 8 MG tablet TAKE 1 TABLET EVERY 8 HOURS AS NEEDED FOR NAUSEA 30 tablet 0  . PROAIR HFA 108 (90 BASE) MCG/ACT inhaler TWO PUFFS EVERY 4 HOURS AS NEEDED 25.5 g 2  . SPIRIVA HANDIHALER 18 MCG inhalation capsule INHALE THE CONTENTS OF 1 CAPSULE DAILY 30 capsule 0  . SYMBICORT 160-4.5  MCG/ACT inhaler USE 2 PUFFS TWICE DAILY 30.6 g 0  . venlafaxine XR (EFFEXOR-XR) 75 MG 24 hr capsule Take 150 mg by mouth at bedtime.     Marland Kitchen buPROPion (WELLBUTRIN XL) 150 MG 24 hr tablet Take 1 tablet (150 mg total) by mouth 2 (two) times daily. (Patient not taking: Reported on 08/31/2016) 60 tablet 0  . nicotine (NICOTROL) 10 MG inhaler Inhale 1 cartridge (1 continuous puffing total) into the lungs as needed for smoking cessation. (Patient not taking: Reported on 09/01/2014) 168 each 2  . omeprazole (PRILOSEC) 20 MG capsule Take 1 capsule (20 mg total) by mouth daily. (Patient not taking: Reported on 08/31/2016) 90 capsule 1  . promethazine (PHENERGAN) 25 MG tablet Take 25 mg by mouth every 6 (six) hours as needed for nausea or vomiting.     No current facility-administered medications on file prior to visit.    Allergies  Allergen Reactions  . Shellfish Allergy Shortness Of Breath and Swelling  . Nitrofurantoin     Other reaction(s): Nausea And Vomiting   Family History  Problem Relation Age of Onset  . Arthritis Mother   . Hypertension Mother   . Arthritis Father   . Prostate cancer Father   . Hyperlipidemia Father   . Heart disease Father   . Diabetes Maternal  Aunt   . Diabetes Maternal Grandmother   . Hypertension Maternal Grandmother   . Kidney disease Maternal Grandmother   . Heart disease Maternal Grandfather   . Heart disease Paternal Grandfather   . Breast cancer Paternal Grandmother   . Crohn's disease Cousin     1st  . Colon cancer Neg Hx    Objective:   Physical Exam BP 124/88 (BP Location: Left Arm, Patient Position: Sitting)   Pulse 73   Wt 230 lb (104.3 kg)   LMP 07/09/2008   SpO2 97%   BMI 39.48 kg/m  Wt Readings from Last 3 Encounters:  08/31/16 230 lb (104.3 kg)  09/01/14 258 lb (117 kg)  08/23/14 249 lb (112.9 kg)  Constitutional: overweight, in NAD Eyes: PERRLA, EOMI, no exophthalmos ENT: moist mucous membranes, mild thyromegaly, no cervical lymphadenopathy Cardiovascular: RRR, No MRG Respiratory: CTA B Gastrointestinal: abdomen soft, NT, ND, BS+ Musculoskeletal: no deformities, strength intact in all 4 Skin: moist, warm, no rashes Neurological: + tremor with outstretched hands, DTR normal in right knee, TKR in left knee   Assessment:     1. Hyperthyroidism - most likely 2/2 toxic MNG - previously on MMI, stopped 07/23/2012 - Thyroid U/S (07/23/2012): there are innumerable nodules in both lobes of the gland Right lobe: 2.0 x 1.6 x 1.2 cm, hyperechoic dominant nodule - mid pole        4.2 x 2.6 x 2.5 cm complex primarily solid nodule - lower pole Left lobe: 1.7 x 1.5 x 1.5 cm hyperechoic nodule - mid pole      2.1 x 1.8 x 1.8 cm mixed solid and cystic nodule - mid pole      2.5 x 2.1 x 2.5 cm isoechoic nodule - lower pole - Thyroid Uptake and Scan (09/05/2012): 1. Findings consistent with toxic multinodular goiter.  2. 24 hour I 131 uptake = 46.6% (normal 10-30%)  - RAI ablation (09/22/2012): 31 mCI (high dose) - Thyroid U/S (08/20/2016, at [facility]) - decrease in size of her nodules: .Thyroid: The thyroid gland is diffusely nodular with the majority of nodules isoechoic. .The right lobe measures 5.1 x  2.4 x 2.7 cm. .The left  lobe measures 5.8 x 3.0 x 3.1 cm. .The isthmus measures 0.8 cm. .Vascular flow: Normal. .Soft tissues of neck: No enlarged lymph nodes nodes or extrathyroidal masses. .Nodules: The 4 largest nodules are as follows:  .1.5 x 1.7 x 1.1 cm nodule in the superior aspect right lobe is predominantly solid (2) and hyperechoic (1).  .1.6 x 1.1 x 0.9 cm nodule in the posterior aspect of the superior right lobe is predominantly solid (2) and hypoechoic (2).  .2.2 x 1.6 x 1.5 cm nodule in the superior aspect left lobe is solid (2) and isoechoic (1).  .1.8 x 1.7 x 1.6 cm nodule in the mid left thyroid lobeis predominantly solid (2) and isoechoic (1).  Punctate echogenic foci (3)are noted.   Plan:     1.  Patient has a history of hyperthyroidism 2/2 TMNG (multiple large thyroid nodules, which appeared hyperactive on her uptake and scan). She had RAI ablation in 2014 after which she was lost for f/u. She now returns after having had an MRI of her neck that showed a thyroid mass. She was then sent for a new thyroid ultrasound that again showed thyroid nodules, all decreased in size compared to the ultrasound obtained in 07/23/2012. I explained that these nodules are not new, and, per her thyroid uptake and scan, they are not "cold", but are producing hormones, which is reassuring. However, I do not have the images of her new ultrasound and I would like to obtain these and decide for or against possible biopsy after I compare the images between the 2 ultrasounds. We may need to biopsy some of these nodules. Therefore, patient signed a release of information form today. As soon as I reviewed the images of her latest ultrasound, I will let her know about further plan. - Of note, she is not bothered by her thyroid nodules, she does not feel the nodules anymore - I  reviewed her previous TFTs, which have normalized after RAI treatment, but would like to recheck her TSH, free  T3 and free T4 today - we'll schedule the next visit in 2 years  Component     Latest Ref Rng & Units 08/31/2016  TSH     0.35 - 4.50 uIU/mL 0.69  Triiodothyronine,Free,Serum     2.3 - 4.2 pg/mL 3.4  T4,Free(Direct)     0.60 - 1.60 ng/dL 0.82  TFTs normal.  Addendum 09/27/16 *  received the images for patient's latest ultrasound from [facility]. None of the nodules appear worrisome. They are most isoechoic or hyperechoic. No biopsy recommended this point.   Philemon Kingdom, MD PhD [facility] Endocrinology

## 2016-09-03 ENCOUNTER — Telehealth: Payer: Self-pay

## 2016-09-03 NOTE — Telephone Encounter (Signed)
-----   Message from Philemon Kingdom, MD sent at 09/03/2016 12:44 PM EST ----- Audrey Lewis, can you please call pt: TFTs are normal. I am still waiting for the CD with her thyroid ultrasound.

## 2016-09-03 NOTE — Telephone Encounter (Signed)
Called patient and gave lab results. Patient had no questions or concerns.  

## 2016-09-28 ENCOUNTER — Telehealth: Payer: Self-pay

## 2016-09-28 NOTE — Telephone Encounter (Signed)
Called patient and advised of Dr.Gherghe's note: Almyra Free,  Can you please call patient: I received the images for her latest ultrasound from [facility]. None of the nodules appear worrisome. No biopsy recommended this point.  Ty,  C   Gave call back number if any questions.

## 2016-09-28 NOTE — Telephone Encounter (Signed)
-----   Message from Philemon Kingdom, MD sent at 09/27/2016  5:05 PM EDT ----- Audrey Lewis, Can you please call patient: I received the images for her latest ultrasound from [facility]. None of the nodules appear worrisome. No biopsy recommended this point.  Ty, C

## 2018-01-15 ENCOUNTER — Emergency Department (HOSPITAL_BASED_OUTPATIENT_CLINIC_OR_DEPARTMENT_OTHER): Payer: BLUE CROSS/BLUE SHIELD

## 2018-01-15 ENCOUNTER — Other Ambulatory Visit: Payer: Self-pay

## 2018-01-15 ENCOUNTER — Encounter (HOSPITAL_BASED_OUTPATIENT_CLINIC_OR_DEPARTMENT_OTHER): Payer: Self-pay | Admitting: *Deleted

## 2018-01-15 ENCOUNTER — Inpatient Hospital Stay (HOSPITAL_BASED_OUTPATIENT_CLINIC_OR_DEPARTMENT_OTHER)
Admission: EM | Admit: 2018-01-15 | Discharge: 2018-01-18 | DRG: 175 | Disposition: A | Discharge status: Home / Self Care | Payer: BLUE CROSS/BLUE SHIELD | Attending: Family Medicine | Admitting: Family Medicine

## 2018-01-15 DIAGNOSIS — G4733 Obstructive sleep apnea (adult) (pediatric): Secondary | ICD-10-CM | POA: Diagnosis present

## 2018-01-15 DIAGNOSIS — Z9884 Bariatric surgery status: Secondary | ICD-10-CM

## 2018-01-15 DIAGNOSIS — I2602 Saddle embolus of pulmonary artery with acute cor pulmonale: Secondary | ICD-10-CM | POA: Diagnosis not present

## 2018-01-15 DIAGNOSIS — Z6841 Body Mass Index (BMI) 40.0 and over, adult: Secondary | ICD-10-CM

## 2018-01-15 DIAGNOSIS — R0602 Shortness of breath: Secondary | ICD-10-CM | POA: Diagnosis not present

## 2018-01-15 DIAGNOSIS — I10 Essential (primary) hypertension: Secondary | ICD-10-CM | POA: Diagnosis present

## 2018-01-15 DIAGNOSIS — N182 Chronic kidney disease, stage 2 (mild): Secondary | ICD-10-CM | POA: Diagnosis present

## 2018-01-15 DIAGNOSIS — H9202 Otalgia, left ear: Secondary | ICD-10-CM | POA: Diagnosis present

## 2018-01-15 DIAGNOSIS — Z91013 Allergy to seafood: Secondary | ICD-10-CM

## 2018-01-15 DIAGNOSIS — H547 Unspecified visual loss: Secondary | ICD-10-CM | POA: Diagnosis present

## 2018-01-15 DIAGNOSIS — Z8249 Family history of ischemic heart disease and other diseases of the circulatory system: Secondary | ICD-10-CM

## 2018-01-15 DIAGNOSIS — I129 Hypertensive chronic kidney disease with stage 1 through stage 4 chronic kidney disease, or unspecified chronic kidney disease: Secondary | ICD-10-CM | POA: Diagnosis present

## 2018-01-15 DIAGNOSIS — M1991 Primary osteoarthritis, unspecified site: Secondary | ICD-10-CM | POA: Diagnosis present

## 2018-01-15 DIAGNOSIS — R778 Other specified abnormalities of plasma proteins: Secondary | ICD-10-CM | POA: Diagnosis present

## 2018-01-15 DIAGNOSIS — Z7951 Long term (current) use of inhaled steroids: Secondary | ICD-10-CM

## 2018-01-15 DIAGNOSIS — I248 Other forms of acute ischemic heart disease: Secondary | ICD-10-CM | POA: Diagnosis present

## 2018-01-15 DIAGNOSIS — N179 Acute kidney failure, unspecified: Secondary | ICD-10-CM | POA: Diagnosis present

## 2018-01-15 DIAGNOSIS — J439 Emphysema, unspecified: Secondary | ICD-10-CM | POA: Diagnosis present

## 2018-01-15 DIAGNOSIS — J9621 Acute and chronic respiratory failure with hypoxia: Secondary | ICD-10-CM | POA: Diagnosis present

## 2018-01-15 DIAGNOSIS — F329 Major depressive disorder, single episode, unspecified: Secondary | ICD-10-CM | POA: Diagnosis present

## 2018-01-15 DIAGNOSIS — I2699 Other pulmonary embolism without acute cor pulmonale: Secondary | ICD-10-CM | POA: Diagnosis present

## 2018-01-15 DIAGNOSIS — D72829 Elevated white blood cell count, unspecified: Secondary | ICD-10-CM | POA: Diagnosis present

## 2018-01-15 DIAGNOSIS — K219 Gastro-esophageal reflux disease without esophagitis: Secondary | ICD-10-CM | POA: Diagnosis present

## 2018-01-15 DIAGNOSIS — Z96652 Presence of left artificial knee joint: Secondary | ICD-10-CM | POA: Diagnosis present

## 2018-01-15 DIAGNOSIS — J449 Chronic obstructive pulmonary disease, unspecified: Secondary | ICD-10-CM | POA: Diagnosis present

## 2018-01-15 DIAGNOSIS — F32A Depression, unspecified: Secondary | ICD-10-CM | POA: Diagnosis present

## 2018-01-15 DIAGNOSIS — R7989 Other specified abnormal findings of blood chemistry: Secondary | ICD-10-CM

## 2018-01-15 DIAGNOSIS — Z79899 Other long term (current) drug therapy: Secondary | ICD-10-CM

## 2018-01-15 DIAGNOSIS — Z87891 Personal history of nicotine dependence: Secondary | ICD-10-CM

## 2018-01-15 LAB — BASIC METABOLIC PANEL
Anion gap: 11 (ref 5–15)
BUN: 34 mg/dL — AB (ref 8–23)
CO2: 25 mmol/L (ref 22–32)
Calcium: 9.3 mg/dL (ref 8.9–10.3)
Chloride: 98 mmol/L (ref 98–111)
Creatinine, Ser: 1.42 mg/dL — ABNORMAL HIGH (ref 0.44–1.00)
GFR calc Af Amer: 45 mL/min — ABNORMAL LOW (ref >60)
GFR, EST NON AFRICAN AMERICAN: 39 mL/min — AB (ref >60)
GLUCOSE: 139 mg/dL — AB (ref 70–99)
POTASSIUM: 4.1 mmol/L (ref 3.5–5.1)
Sodium: 134 mmol/L — ABNORMAL LOW (ref 135–145)

## 2018-01-15 LAB — CBC
HCT: 37.8 % (ref 36.0–46.0)
Hemoglobin: 13.5 g/dL (ref 12.0–15.0)
MCH: 34.1 pg — AB (ref 26.0–34.0)
MCHC: 35.7 g/dL (ref 30.0–36.0)
MCV: 95.5 fL (ref 78.0–100.0)
Platelets: 173 10*3/uL (ref 150–400)
RBC: 3.96 MIL/uL (ref 3.87–5.11)
RDW: 11.8 % (ref 11.5–15.5)
WBC: 14.6 10*3/uL — AB (ref 4.0–10.5)

## 2018-01-15 LAB — BRAIN NATRIURETIC PEPTIDE: B Natriuretic Peptide: 475.1 pg/mL — ABNORMAL HIGH (ref 0.0–100.0)

## 2018-01-15 LAB — TROPONIN I: Troponin I: 0.17 ng/mL (ref <0.03)

## 2018-01-15 MED ORDER — IPRATROPIUM-ALBUTEROL 0.5-2.5 (3) MG/3ML IN SOLN
RESPIRATORY_TRACT | Status: AC
  Administered 2018-01-15: 3 mL
  Filled 2018-01-15: qty 3

## 2018-01-15 MED ORDER — IOPAMIDOL (ISOVUE-370) INJECTION 76%
80.0000 mL | Freq: Once | INTRAVENOUS | Status: AC | PRN
  Administered 2018-01-15: 80 mL via INTRAVENOUS

## 2018-01-15 MED ORDER — HEPARIN BOLUS VIA INFUSION
5000.0000 [IU] | Freq: Once | INTRAVENOUS | Status: AC
  Administered 2018-01-15: 5000 [IU] via INTRAVENOUS

## 2018-01-15 MED ORDER — HEPARIN (PORCINE) IN NACL 100-0.45 UNIT/ML-% IJ SOLN
1400.0000 [IU]/h | INTRAMUSCULAR | Status: DC
  Administered 2018-01-15 – 2018-01-17 (×2): 1400 [IU]/h via INTRAVENOUS
  Filled 2018-01-15 (×3): qty 250

## 2018-01-15 MED ORDER — ASPIRIN 81 MG PO CHEW
324.0000 mg | CHEWABLE_TABLET | Freq: Once | ORAL | Status: AC
  Administered 2018-01-15: 324 mg via ORAL
  Filled 2018-01-15: qty 4

## 2018-01-15 MED ORDER — SODIUM CHLORIDE 0.9 % IV BOLUS
500.0000 mL | Freq: Once | INTRAVENOUS | Status: AC
  Administered 2018-01-15: 500 mL via INTRAVENOUS

## 2018-01-15 MED ORDER — IPRATROPIUM-ALBUTEROL 0.5-2.5 (3) MG/3ML IN SOLN
3.0000 mL | Freq: Four times a day (QID) | RESPIRATORY_TRACT | Status: DC
  Administered 2018-01-15 – 2018-01-16 (×2): 3 mL via RESPIRATORY_TRACT
  Filled 2018-01-15: qty 3

## 2018-01-15 NOTE — ED Notes (Signed)
ED Provider at bedside. 

## 2018-01-15 NOTE — ED Notes (Signed)
Date and time results received: 01/15/18 2149 (use smartphrase ".now" to insert current time)  Test: troponin Critical Value: 0.17  Name of Provider Notified: Dr. Regenia Skeeter  Orders Received? Or Actions Taken?: no new orders at this time

## 2018-01-15 NOTE — Progress Notes (Signed)
ANTICOAGULATION CONSULT NOTE - Initial Consult  Pharmacy Consult for Heparin Indication: pulmonary embolus  Allergies  Allergen Reactions  . Shellfish Allergy Shortness Of Breath and Swelling  . Nitrofurantoin     Other reaction(s): Nausea And Vomiting    Patient Measurements: Height: 5\' 4"  (162.6 cm) Weight: 248 lb (112.5 kg) IBW/kg (Calculated) : 54.7 Heparin Dosing Weight: 81.6 kg  Vital Signs: Temp: 98.3 F (36.8 C) (07/10 2002) Temp Source: Oral (07/10 2002) BP: 108/84 (07/10 2100) Pulse Rate: 92 (07/10 2100)  Labs: Recent Labs    01/15/18 2051 01/15/18 2119  HGB 13.5  --   HCT 37.8  --   PLT 173  --   CREATININE 1.42*  --   TROPONINI  --  0.17*    Estimated Creatinine Clearance: 51.1 mL/min (A) (by C-G formula based on SCr of 1.42 mg/dL (H)).   Medical History: Past Medical History:  Diagnosis Date  . Allergy   . Arthritis   . Asthma   . COPD (chronic obstructive pulmonary disease) ([place])   . Depression   . GERD (gastroesophageal reflux disease)   . Glaucoma   . H/O sleep apnea   . History of chicken pox   . History of high blood pressure   . Hyperthyroidism   . Legally blind    color blind  . OSA (obstructive sleep apnea) 08/18/2012  . Urinary incontinence     Assessment: 62 year old female presented to [facility] ED with hypoxia -worsening with exertion, chest soreness. CT Angio pending. No history of recent bleeding. Patient is not on anticoagulants PTA.  CBC wnl. Troponin 0.17.  SCr 1.42, est CrCl 51 mL/min  Goal of Therapy:  Heparin level 0.3-0.7 units/ml Monitor platelets by anticoagulation protocol: Yes   Plan:  Heparin bolus 5000 units IV x1. Heparin drip at 1400 units/hr.  Heparin level in 6 hours.  Daily Heparin level and CBC.  F/up CT Angio.   Sloan Leiter, PharmD, BCPS, BCCCP Clinical Pharmacist Clinical phone 01/15/2018 until 11:[phone] After hours, please call #[phone] 01/15/2018,10:12 PM

## 2018-01-15 NOTE — ED Provider Notes (Signed)
[facility] EMERGENCY DEPARTMENT Provider Note   CSN: [id number] Arrival date & time: 01/15/18  1956     History   Chief Complaint Chief Complaint  Patient presents with  . COPD    HPI Audrey Lewis is a 62 y.o. female.  HPI  62 year old female presents with shortness of breath.  Started yesterday.  She notices it mostly whenever doing activity, especially minimal activity.  She will most passed out while she was in the shower.  No cough, fever, runny nose.  No leg swelling.  She feels like her chest and upper abdomen are sore from heavy breathing.  She has a history of COPD/emphysema but states this does not feel like prior flares.  When she was transferring from the wheelchair to the stretcher, her oxygen saturation went to 84%.  She was placed on 2 L nasal cannula.  She was given a breathing treatment prior to me seeing her but respiratory states that she did not have any significant wheezing.  Past Medical History:  Diagnosis Date  . Allergy   . Arthritis   . Asthma   . COPD (chronic obstructive pulmonary disease) ([place])   . Depression   . GERD (gastroesophageal reflux disease)   . Glaucoma   . H/O sleep apnea   . History of chicken pox   . History of high blood pressure   . Hyperthyroidism   . Legally blind    color blind  . OSA (obstructive sleep apnea) 08/18/2012  . Urinary incontinence     Patient Active Problem List   Diagnosis Date Noted  . H/O toxic multinodular goiter 08/31/2016  . Nausea without vomiting 07/26/2014  . Skin picking habit 02/01/2014  . Morbid obesity ([place]) 01/15/2014  . HTN (hypertension) 01/15/2014  . Elevated blood pressure reading without diagnosis of hypertension 02/18/2013  . Blindness, legal 12/13/2012  . Routine general medical examination at a health care facility 08/18/2012  . OSA (obstructive sleep apnea) 08/18/2012  . Tobacco use disorder 08/18/2012  . COPD (chronic obstructive pulmonary disease) ([place]) 07/23/2012  .  Depression with anxiety 07/23/2012  . GERD (gastroesophageal reflux disease) 07/23/2012  . OA (osteoarthritis) 07/23/2012  . Glaucoma 07/23/2012  . Hyperthyroidism 07/23/2012  . Allergic rhinitis 07/23/2012  . Stress incontinence 07/23/2012    Past Surgical History:  Procedure Laterality Date  . [place] SURGERY  2011  . BREAST REDUCTION SURGERY    . CARPAL TUNNEL RELEASE Bilateral   . CHOLECYSTECTOMY     with hernia repair  . JOINT REPLACEMENT Left 2012   left knee  . ROTATOR CUFF REPAIR Right 2000  . TUBAL LIGATION  1983     OB History   None      Home Medications    Prior to Admission medications   Medication Sig Start Date End Date Taking? Authorizing Provider  lisinopril-hydrochlorothiazide (PRINZIDE,ZESTORETIC) 20-12.5 MG tablet Take 1 tablet by mouth daily.   Yes [provider]  ziprasidone (GEODON) 20 MG capsule Take 20 mg by mouth 2 (two) times daily with a meal.   Yes [provider]  albuterol (PROVENTIL) (2.5 MG/3ML) 0.083% nebulizer solution Take 3 mLs (2.5 mg total) by nebulization every 6 (six) hours as needed for wheezing. 07/25/12   Debbrah Alar, NP  bimatoprost (LUMIGAN) 0.01 % SOLN Place 1 drop into both eyes every evening.    [provider]  BRINTELLIX 20 MG TABS Take 20 mg by mouth daily.    [provider]  buPROPion (WELLBUTRIN XL)  150 MG 24 hr tablet Take 1 tablet (150 mg total) by mouth 2 (two) times daily. Patient not taking: Reported on 08/31/2016 09/02/14   Rigoberto Noel, MD  hydrochlorothiazide (HYDRODIURIL) 25 MG tablet Take 1 tablet (25 mg total) by mouth daily. 08/09/14   Debbrah Alar, NP  nicotine (NICOTROL) 10 MG inhaler Inhale 1 cartridge (1 continuous puffing total) into the lungs as needed for smoking cessation. Patient not taking: Reported on 09/01/2014 07/15/14   Parrett, Fonnie Mu, NP  omeprazole (PRILOSEC) 20 MG capsule Take 1 capsule (20 mg total) by mouth daily. Patient not taking:  Reported on 08/31/2016 08/09/14   Debbrah Alar, NP  ondansetron (ZOFRAN) 8 MG tablet TAKE 1 TABLET EVERY 8 HOURS AS NEEDED FOR NAUSEA 10/02/14   Debbrah Alar, NP  PROAIR HFA 108 (90 BASE) MCG/ACT inhaler TWO PUFFS EVERY 4 HOURS AS NEEDED 10/04/14   Rigoberto Noel, MD  promethazine (PHENERGAN) 25 MG tablet Take 25 mg by mouth every 6 (six) hours as needed for nausea or vomiting.    [provider]  SPIRIVA HANDIHALER 18 MCG inhalation capsule INHALE THE CONTENTS OF 1 CAPSULE DAILY 10/03/15   Rigoberto Noel, MD  SYMBICORT 160-4.5 MCG/ACT inhaler USE 2 PUFFS TWICE DAILY 07/12/14   Rigoberto Noel, MD  venlafaxine XR (EFFEXOR-XR) 75 MG 24 hr capsule Take 150 mg by mouth at bedtime.  09/19/12   Debbrah Alar, NP    Family History Family History  Problem Relation Age of Onset  . Arthritis Mother   . Hypertension Mother   . Arthritis Father   . Prostate cancer Father   . Hyperlipidemia Father   . Heart disease Father   . Diabetes Maternal Aunt   . Diabetes Maternal Grandmother   . Hypertension Maternal Grandmother   . Kidney disease Maternal Grandmother   . Heart disease Maternal Grandfather   . Heart disease Paternal Grandfather   . Breast cancer Paternal Grandmother   . Crohn's disease Cousin        1st  . Colon cancer Neg Hx     Social History Social History   Tobacco Use  . Smoking status: Former Smoker    Packs/day: 2.00    Years: 40.00    Pack years: 80.00    Types: Cigarettes    Last attempt to quit: 07/09/2016    Years since quitting: 1.5  . Smokeless tobacco: Never Used  . Tobacco comment: Started smoking at age 19.  Currently using ecig.  Will smoke cigeratte occas.    Substance Use Topics  . Alcohol use: No    Alcohol/week: 0.0 oz  . Drug use: No     Allergies   Shellfish allergy and Nitrofurantoin   Review of Systems Review of Systems  Constitutional: Negative for fever.  HENT: Negative for congestion and rhinorrhea.   Respiratory:  Positive for shortness of breath. Negative for cough.   Cardiovascular: Positive for chest pain. Negative for leg swelling.  Gastrointestinal: Positive for abdominal pain. Negative for vomiting.  All other systems reviewed and are negative.    Physical Exam Updated Vital Signs BP (!) 119/99   Pulse 82   Temp 98 F (36.7 C) (Oral)   Resp (!) 25   Ht 5\' 4"  (1.626 m)   Wt 112.5 kg (248 lb)   LMP 07/09/2008   SpO2 97%   BMI 42.57 kg/m   Physical Exam  Constitutional: She is oriented to person, place, and time. She appears well-developed and well-nourished.  No distress.  HENT:  Head: Normocephalic and atraumatic.  Right Ear: External ear normal.  Left Ear: External ear normal.  Nose: Nose normal.  Eyes: Right eye exhibits no discharge. Left eye exhibits no discharge.  Cardiovascular: Normal rate, regular rhythm and normal heart sounds.  Pulmonary/Chest: Effort normal. No accessory muscle usage. Tachypnea noted. She has decreased breath sounds in the right lower field and the left lower field.  Abdominal: Soft. There is no tenderness.  Musculoskeletal: She exhibits no edema.  Neurological: She is alert and oriented to person, place, and time.  Skin: Skin is warm and dry. She is not diaphoretic.  Nursing note and vitals reviewed.    ED Treatments / Results  Labs (all labs ordered are listed, but only abnormal results are displayed) Labs Reviewed  CBC - Abnormal; Notable for the following components:      Result Value   WBC 14.6 (*)    MCH 34.1 (*)    All other components within normal limits  BASIC METABOLIC PANEL - Abnormal; Notable for the following components:   Sodium 134 (*)    Glucose, Bld 139 (*)    BUN 34 (*)    Creatinine, Ser 1.42 (*)    GFR calc non Af Amer 39 (*)    GFR calc Af Amer 45 (*)    All other components within normal limits  TROPONIN I - Abnormal; Notable for the following components:   Troponin I 0.17 (*)    All other components within normal  limits  BRAIN NATRIURETIC PEPTIDE - Abnormal; Notable for the following components:   B Natriuretic Peptide 475.1 (*)    All other components within normal limits  CBC  HEPARIN LEVEL (UNFRACTIONATED)    EKG EKG Interpretation  Date/Time:  Wednesday January 15 2018 21:55:14 EDT Ventricular Rate:  91 PR Interval:    QRS Duration: 93 QT Interval:  387 QTC Calculation: 477 R Axis:   -2 Text Interpretation:  Sinus rhythm Anterior infarct, old No old tracing to compare Confirmed by Sherwood Gambler [phone]) on 01/15/2018 10:02:02 PM   Radiology Dg Chest 2 View  Result Date: 01/15/2018 CLINICAL DATA:  Shortness of breath. Patient reports COPD exacerbation. EXAM: CHEST - 2 VIEW COMPARISON:  Radiograph 03/18/2014 FINDINGS: Chronic hyperinflation and bronchial thickening.The cardiomediastinal contours are unchanged, heart size upper normal. Calcified granuloma in the right lung apex. Pulmonary vasculature is normal. No consolidation, pleural effusion, or pneumothorax. No acute osseous abnormalities are seen. Surgical anchors in the right shoulder. IMPRESSION: 1. Chronic hyperinflation and bronchial thickening consistent with COPD, unchanged from 2015 exam. 2. No acute findings. Electronically Signed   By: Jeb Levering M.D.   On: 01/15/2018 21:54   Ct Angio Chest Pe W And/or Wo Contrast  Result Date: 01/15/2018 CLINICAL DATA:  Shortness of breath.  Tachycardia and COPD. EXAM: CT ANGIOGRAPHY CHEST WITH CONTRAST TECHNIQUE: Multidetector CT imaging of the chest was performed using the standard protocol during bolus administration of intravenous contrast. Multiplanar CT image reconstructions and MIPs were obtained to evaluate the vascular anatomy. CONTRAST:  42mL ISOVUE-370 IOPAMIDOL (ISOVUE-370) INJECTION 76% COMPARISON:  Radiographs earlier this day. FINDINGS: Cardiovascular: Examination is positive for pulmonary embolus with large thromboembolic burden. Large nearly occlusive thrombus in the left main  pulmonary artery extends into the upper and lower lobar branches, near complete occlusion of lingular branches, and rather extensive but incomplete occlusion of lower lobe segmental branches. On the right filling defects in the distal main pulmonary artery extending into all lobar  and left lower lobe subsegmental branches. There is straightening of the intraventricular septum with right heart strain, RV to LV ratio of 2.45. thoracic aorta is normal in caliber with minimal atherosclerosis. Heart size upper normal. No pericardial effusion. Mediastinum/Nodes: No adenopathy. Heterogeneous prominent sized thyroid gland with bilateral calcifications consistent with goiter. The esophagus is decompressed. Lungs/Pleura: No evidence pulmonary infarct or focal consolidation. Central bronchial thickening, likely chronic. No pleural effusion or pulmonary edema. No pulmonary mass. Upper Abdomen: Bariatric surgery partially included. Postcholecystectomy. No acute upper abdominal findings. Musculoskeletal: Degenerative change in the spine. There are no acute or suspicious osseous abnormalities. Review of the MIP images confirms the above findings. IMPRESSION: 1. Positive for acute PE with large clot burden and CT evidence of right heart strain (RV/LV Ratio = 2.45) consistent with at least submassive (intermediate risk) PE. The presence of right heart strain has been associated with an increased risk of morbidity and mortality. Please activate Code PE by paging [phone]. 2. Mild bronchial thickening which is likely chronic. Critical Value/emergent results were called by telephone at the time of interpretation on 01/15/2018 at 11:09 pm to Dr. Sherwood Gambler , who verbally acknowledged these results. Electronically Signed   By: Jeb Levering M.D.   On: 01/15/2018 23:10    Procedures .Critical Care Performed by: Sherwood Gambler, MD Authorized by: Sherwood Gambler, MD   Critical care provider statement:    Critical care  time (minutes):  40   Critical care time was exclusive of:  Separately billable procedures and treating other patients   Critical care was necessary to treat or prevent imminent or life-threatening deterioration of the following conditions:  Circulatory failure, respiratory failure and cardiac failure   Critical care was time spent personally by me on the following activities:  Development of treatment plan with patient or surrogate, discussions with consultants, evaluation of patient's response to treatment, examination of patient, obtaining history from patient or surrogate, ordering and performing treatments and interventions, ordering and review of laboratory studies, ordering and review of radiographic studies, pulse oximetry, re-evaluation of patient's condition and review of old charts   (including critical care time)  Medications Ordered in ED Medications  ipratropium-albuterol (DUONEB) 0.5-2.5 (3) MG/3ML nebulizer solution 3 mL (3 mLs Nebulization Given 01/15/18 2006)  heparin ADULT infusion 100 units/mL (25000 units/241mL sodium chloride 0.45%) (1,400 Units/hr Intravenous New Bag/Given 01/15/18 2301)  ipratropium-albuterol (DUONEB) 0.5-2.5 (3) MG/3ML nebulizer solution (3 mLs  Given 01/15/18 2005)  aspirin chewable tablet 324 mg (324 mg Oral Given 01/15/18 2157)  sodium chloride 0.9 % bolus 500 mL (0 mLs Intravenous Stopped 01/15/18 2314)  iopamidol (ISOVUE-370) 76 % injection 80 mL (80 mLs Intravenous Contrast Given 01/15/18 2247)  heparin bolus via infusion 5,000 Units (5,000 Units Intravenous Bolus from Bag 01/15/18 2301)     Initial Impression / Assessment and Plan / ED Course  I have reviewed the triage vital signs and the nursing notes.  Pertinent labs & imaging results that were available during my care of the patient were reviewed by me and considered in my medical decision making (see chart for details).     Patient's CT scan shows a large PE clot burden with saddle pulmonary  embolism.  There is evidence of right heart strain.  However besides mild hypoxia she has been hemodynamically stable.  She does have a small troponin leak and a BNP of 475.  She has been placed on IV heparin.  I discussed with the ICU, Dr. Lucile Shutters, who feels she is  stable for stepdown admission to the hospitalist service and to have a EKOS consult in the morning.  Dr Alcario Drought to admit.  Final Clinical Impressions(s) / ED Diagnoses   Final diagnoses:  Acute saddle pulmonary embolism with acute cor pulmonale [facility])    ED Discharge Orders    None       Sherwood Gambler, MD 01/16/18 [phone]

## 2018-01-15 NOTE — ED Triage Notes (Signed)
COPD excerebration x several days. Worsening with exertion.  Sats 86% when checking in, up to 94% once sitting still.

## 2018-01-16 ENCOUNTER — Inpatient Hospital Stay (HOSPITAL_COMMUNITY): Payer: BLUE CROSS/BLUE SHIELD

## 2018-01-16 DIAGNOSIS — R0602 Shortness of breath: Secondary | ICD-10-CM | POA: Diagnosis present

## 2018-01-16 DIAGNOSIS — F32A Depression, unspecified: Secondary | ICD-10-CM | POA: Diagnosis present

## 2018-01-16 DIAGNOSIS — F329 Major depressive disorder, single episode, unspecified: Secondary | ICD-10-CM | POA: Diagnosis present

## 2018-01-16 DIAGNOSIS — Z7951 Long term (current) use of inhaled steroids: Secondary | ICD-10-CM | POA: Diagnosis not present

## 2018-01-16 DIAGNOSIS — R748 Abnormal levels of other serum enzymes: Secondary | ICD-10-CM

## 2018-01-16 DIAGNOSIS — K219 Gastro-esophageal reflux disease without esophagitis: Secondary | ICD-10-CM | POA: Diagnosis present

## 2018-01-16 DIAGNOSIS — N182 Chronic kidney disease, stage 2 (mild): Secondary | ICD-10-CM

## 2018-01-16 DIAGNOSIS — D72829 Elevated white blood cell count, unspecified: Secondary | ICD-10-CM | POA: Diagnosis present

## 2018-01-16 DIAGNOSIS — H547 Unspecified visual loss: Secondary | ICD-10-CM | POA: Diagnosis present

## 2018-01-16 DIAGNOSIS — Z8249 Family history of ischemic heart disease and other diseases of the circulatory system: Secondary | ICD-10-CM | POA: Diagnosis not present

## 2018-01-16 DIAGNOSIS — Z87891 Personal history of nicotine dependence: Secondary | ICD-10-CM | POA: Diagnosis not present

## 2018-01-16 DIAGNOSIS — Z91013 Allergy to seafood: Secondary | ICD-10-CM | POA: Diagnosis not present

## 2018-01-16 DIAGNOSIS — H9202 Otalgia, left ear: Secondary | ICD-10-CM | POA: Diagnosis present

## 2018-01-16 DIAGNOSIS — R778 Other specified abnormalities of plasma proteins: Secondary | ICD-10-CM | POA: Diagnosis present

## 2018-01-16 DIAGNOSIS — N179 Acute kidney failure, unspecified: Secondary | ICD-10-CM | POA: Diagnosis present

## 2018-01-16 DIAGNOSIS — Z79899 Other long term (current) drug therapy: Secondary | ICD-10-CM | POA: Diagnosis not present

## 2018-01-16 DIAGNOSIS — G4733 Obstructive sleep apnea (adult) (pediatric): Secondary | ICD-10-CM | POA: Diagnosis present

## 2018-01-16 DIAGNOSIS — I2699 Other pulmonary embolism without acute cor pulmonale: Secondary | ICD-10-CM | POA: Diagnosis not present

## 2018-01-16 DIAGNOSIS — J9621 Acute and chronic respiratory failure with hypoxia: Secondary | ICD-10-CM | POA: Diagnosis present

## 2018-01-16 DIAGNOSIS — Z6841 Body Mass Index (BMI) 40.0 and over, adult: Secondary | ICD-10-CM | POA: Diagnosis not present

## 2018-01-16 DIAGNOSIS — I10 Essential (primary) hypertension: Secondary | ICD-10-CM | POA: Diagnosis not present

## 2018-01-16 DIAGNOSIS — M1991 Primary osteoarthritis, unspecified site: Secondary | ICD-10-CM | POA: Diagnosis present

## 2018-01-16 DIAGNOSIS — J42 Unspecified chronic bronchitis: Secondary | ICD-10-CM | POA: Diagnosis not present

## 2018-01-16 DIAGNOSIS — Z96652 Presence of left artificial knee joint: Secondary | ICD-10-CM | POA: Diagnosis present

## 2018-01-16 DIAGNOSIS — J439 Emphysema, unspecified: Secondary | ICD-10-CM | POA: Diagnosis present

## 2018-01-16 DIAGNOSIS — I2602 Saddle embolus of pulmonary artery with acute cor pulmonale: Secondary | ICD-10-CM | POA: Diagnosis present

## 2018-01-16 DIAGNOSIS — R7989 Other specified abnormal findings of blood chemistry: Secondary | ICD-10-CM | POA: Diagnosis present

## 2018-01-16 DIAGNOSIS — I129 Hypertensive chronic kidney disease with stage 1 through stage 4 chronic kidney disease, or unspecified chronic kidney disease: Secondary | ICD-10-CM | POA: Diagnosis present

## 2018-01-16 DIAGNOSIS — Z9884 Bariatric surgery status: Secondary | ICD-10-CM | POA: Diagnosis not present

## 2018-01-16 DIAGNOSIS — I248 Other forms of acute ischemic heart disease: Secondary | ICD-10-CM | POA: Diagnosis present

## 2018-01-16 LAB — ECHOCARDIOGRAM COMPLETE
Height: 64 in
WEIGHTICAEL: 4007.08 [oz_av]

## 2018-01-16 LAB — LIPID PANEL
CHOL/HDL RATIO: 1.8 ratio
CHOLESTEROL: 169 mg/dL (ref 0–200)
HDL: 93 mg/dL (ref >40)
LDL Cholesterol: 69 mg/dL (ref 0–99)
Triglycerides: 35 mg/dL (ref <150)
VLDL: 7 mg/dL (ref 0–40)

## 2018-01-16 LAB — HEPARIN LEVEL (UNFRACTIONATED)
HEPARIN UNFRACTIONATED: 0.58 [IU]/mL (ref 0.30–0.70)
Heparin Unfractionated: 0.52 IU/mL (ref 0.30–0.70)

## 2018-01-16 LAB — BASIC METABOLIC PANEL
Anion gap: 11 (ref 5–15)
BUN: 25 mg/dL — AB (ref 8–23)
CO2: 25 mmol/L (ref 22–32)
Calcium: 9.3 mg/dL (ref 8.9–10.3)
Chloride: 100 mmol/L (ref 98–111)
Creatinine, Ser: 1.33 mg/dL — ABNORMAL HIGH (ref 0.44–1.00)
GFR calc Af Amer: 49 mL/min — ABNORMAL LOW (ref >60)
GFR, EST NON AFRICAN AMERICAN: 42 mL/min — AB (ref >60)
GLUCOSE: 103 mg/dL — AB (ref 70–99)
POTASSIUM: 3.7 mmol/L (ref 3.5–5.1)
Sodium: 136 mmol/L (ref 135–145)

## 2018-01-16 LAB — HEMOGLOBIN A1C
Hgb A1c MFr Bld: 4.5 % — ABNORMAL LOW (ref 4.8–5.6)
MEAN PLASMA GLUCOSE: 82.45 mg/dL

## 2018-01-16 LAB — CBC
HEMATOCRIT: 35.9 % — AB (ref 36.0–46.0)
Hemoglobin: 12.2 g/dL (ref 12.0–15.0)
MCH: 33.1 pg (ref 26.0–34.0)
MCHC: 34 g/dL (ref 30.0–36.0)
MCV: 97.3 fL (ref 78.0–100.0)
Platelets: 164 10*3/uL (ref 150–400)
RBC: 3.69 MIL/uL — ABNORMAL LOW (ref 3.87–5.11)
RDW: 12.1 % (ref 11.5–15.5)
WBC: 10.9 10*3/uL — AB (ref 4.0–10.5)

## 2018-01-16 LAB — MRSA PCR SCREENING: MRSA BY PCR: NEGATIVE

## 2018-01-16 LAB — TROPONIN I
TROPONIN I: 0.1 ng/mL — AB (ref <0.03)
Troponin I: 0.07 ng/mL (ref <0.03)

## 2018-01-16 LAB — HIV ANTIBODY (ROUTINE TESTING W REFLEX): HIV SCREEN 4TH GENERATION: NONREACTIVE

## 2018-01-16 MED ORDER — MOMETASONE FURO-FORMOTEROL FUM 200-5 MCG/ACT IN AERO
2.0000 | INHALATION_SPRAY | Freq: Two times a day (BID) | RESPIRATORY_TRACT | Status: DC
  Administered 2018-01-16 – 2018-01-18 (×5): 2 via RESPIRATORY_TRACT
  Filled 2018-01-16: qty 8.8

## 2018-01-16 MED ORDER — ORAL CARE MOUTH RINSE
15.0000 mL | Freq: Two times a day (BID) | OROMUCOSAL | Status: DC
  Administered 2018-01-16 – 2018-01-18 (×5): 15 mL via OROMUCOSAL

## 2018-01-16 MED ORDER — DM-GUAIFENESIN ER 30-600 MG PO TB12
1.0000 | ORAL_TABLET | Freq: Two times a day (BID) | ORAL | Status: DC | PRN
  Administered 2018-01-17: 1 via ORAL
  Filled 2018-01-16: qty 1

## 2018-01-16 MED ORDER — ONDANSETRON HCL 4 MG/2ML IJ SOLN
4.0000 mg | Freq: Three times a day (TID) | INTRAMUSCULAR | Status: DC | PRN
  Administered 2018-01-16 – 2018-01-17 (×3): 4 mg via INTRAVENOUS
  Filled 2018-01-16 (×3): qty 2

## 2018-01-16 MED ORDER — VORTIOXETINE HBR 20 MG PO TABS
20.0000 mg | ORAL_TABLET | Freq: Every day | ORAL | Status: DC
  Administered 2018-01-16 – 2018-01-18 (×3): 20 mg via ORAL
  Filled 2018-01-16 (×3): qty 1

## 2018-01-16 MED ORDER — ZOLPIDEM TARTRATE 5 MG PO TABS: 5.0000 mg | ORAL_TABLET | Freq: Every evening | ORAL | Status: DC | PRN

## 2018-01-16 MED ORDER — LATANOPROST 0.005 % OP SOLN
1.0000 [drp] | Freq: Every day | OPHTHALMIC | Status: DC
  Administered 2018-01-16 – 2018-01-17 (×2): 1 [drp] via OPHTHALMIC
  Filled 2018-01-16: qty 2.5

## 2018-01-16 MED ORDER — POLYETHYLENE GLYCOL 3350 17 G PO PACK: 17.0000 g | PACK | Freq: Every day | ORAL | Status: DC | PRN

## 2018-01-16 MED ORDER — HYDRALAZINE HCL 20 MG/ML IJ SOLN: 5.0000 mg | INTRAMUSCULAR | Status: DC | PRN

## 2018-01-16 MED ORDER — PANTOPRAZOLE SODIUM 40 MG PO TBEC: 40.0000 mg | DELAYED_RELEASE_TABLET | Freq: Every day | ORAL | Status: DC | PRN

## 2018-01-16 MED ORDER — OXYCODONE-ACETAMINOPHEN 5-325 MG PO TABS: 1.0000 | ORAL_TABLET | ORAL | Status: DC | PRN

## 2018-01-16 MED ORDER — MORPHINE SULFATE (PF) 2 MG/ML IV SOLN: 2.0000 mg | INTRAVENOUS | Status: DC | PRN

## 2018-01-16 MED ORDER — IPRATROPIUM-ALBUTEROL 0.5-2.5 (3) MG/3ML IN SOLN
3.0000 mL | Freq: Two times a day (BID) | RESPIRATORY_TRACT | Status: DC
  Administered 2018-01-16 – 2018-01-18 (×3): 3 mL via RESPIRATORY_TRACT
  Filled 2018-01-16 (×4): qty 3

## 2018-01-16 MED ORDER — ZIPRASIDONE HCL 20 MG PO CAPS
20.0000 mg | ORAL_CAPSULE | Freq: Two times a day (BID) | ORAL | Status: DC
  Administered 2018-01-16 – 2018-01-18 (×6): 20 mg via ORAL
  Filled 2018-01-16 (×6): qty 1

## 2018-01-16 MED ORDER — TIOTROPIUM BROMIDE MONOHYDRATE 18 MCG IN CAPS: 1.0000 | ORAL_CAPSULE | Freq: Every day | RESPIRATORY_TRACT | Status: DC

## 2018-01-16 MED ORDER — ALBUTEROL SULFATE (2.5 MG/3ML) 0.083% IN NEBU
2.5000 mg | INHALATION_SOLUTION | RESPIRATORY_TRACT | Status: DC | PRN
  Administered 2018-01-17 – 2018-01-18 (×3): 2.5 mg via RESPIRATORY_TRACT
  Filled 2018-01-16 (×3): qty 3

## 2018-01-16 MED ORDER — ASPIRIN 81 MG PO CHEW
324.0000 mg | CHEWABLE_TABLET | Freq: Every day | ORAL | Status: DC
  Administered 2018-01-16: 324 mg via ORAL
  Filled 2018-01-16 (×2): qty 4

## 2018-01-16 MED ORDER — ACETAMINOPHEN 325 MG PO TABS
650.0000 mg | ORAL_TABLET | Freq: Four times a day (QID) | ORAL | Status: DC | PRN
  Administered 2018-01-16: 650 mg via ORAL
  Filled 2018-01-16: qty 2

## 2018-01-16 MED ORDER — VENLAFAXINE HCL ER 75 MG PO CP24
150.0000 mg | ORAL_CAPSULE | Freq: Every day | ORAL | Status: DC
  Administered 2018-01-16 – 2018-01-17 (×2): 150 mg via ORAL
  Filled 2018-01-16 (×2): qty 2

## 2018-01-16 NOTE — Progress Notes (Signed)
#   4. S/W JOYCE @ [facility] # [phone]    1. ELIQUIS 2.5 MG BID  COVER- YES  CO-PAY- $ 30.00  TIER- 3 DRUG  PRIOR APPROVAL- NO   2. ELIQUIS 5 MG BID  COVER- YES  CO-PAY$ 30.00  TIER- 3 DRUG  PRIOR APPROVAL- NO   3. XARELTO 15 MG BID  COVER- YES  CO-PAY- $ 30.00  TIER- 3 DRUG  PRIOR APPROVAL- YES    4. XARELTO 20 MG DAILY  COVER- YES  CO-PAY- $ 30.00  TIER- 3 DRUG  PRIOR APPROVAL- NO   NO DEDUCTIBLE   PREFERRED PHARMACY : YES  BENNETT  HARRIS TEETER  [facility]  [facility]

## 2018-01-16 NOTE — H&P (Signed)
History and Physical    Audrey Lewis INO:[id number] DOB: 03/26/1956 DOA: 01/15/2018  Referring MD/NP/PA:   PCP: Concepcion Elk, MD   Patient coming from:  The patient is coming from home.  At baseline, pt is independent for most of ADL.   Chief Complaint: SOB  HPI: Audrey Lewis is a 62 y.o. female with medical history significant of COPD asthma, hypertension, GERD, morbid obesity, CKD-2, depression, poor vision bilaterally, OSA not on CPAP, who presents with shortness of breath.  Patient states that she has been having shortness of breath in the past 3 days, which has been progressively getting worse.  Shortness of breath is aggravated by even mild exertion.  She has some mild dry cough, but no fever or chills.  She states that she has mild chest pain which is located in the lower chest and epigastric area, sharp, nonradiating.  Patient denies recent long distance traveling.  No tenderness in the calf area.  Per report, patient had oxygen desaturation to 86% on room air.  She states that she almost passed out in shower, but did not.  No unilateral weakness, numbness or tingling his extremities.  No hearing loss.  She has chronic poor vision problem which has not changed.  Patient has some nausea, but no vomiting, diarrhea or abdominal pain.  No symptoms of UTI.  Patient states that she has chronic intermittent left ear pain which is not due to infection.  She usually takes Tylenol with good relief.  Today she has recurrent left ear pain without any ear drainage or hearing loss.  ED Course: pt was found to have WBC 14.6, BNP 475, troponin 0 0.17, worsening renal function, temperature normal, heart rate 80s, tachypnea, chest x-ray showed COPD.  CT angiogram showed large pulmonary embolism with CT evidence of right heart strain.  Patient is admitted to stepdown as inpatient.  PCCM, Dr. Lucile Shutters was consulted.  Review of Systems:   General: no fevers, chills, no body weight gain, has fatigue HEENT: has poor  vision, no hearing changes or sore throat Respiratory: has dyspnea, coughing, no wheezing CV: has chest pain, no palpitations GI: has nausea, no vomiting, abdominal pain, diarrhea, constipation GU: no dysuria, burning n urination, increased urinary frequency, hematuria  Ext: has leg edema Neuro: no unilateral weakness, numbness, or tingling, no vision change or hearing loss Skin: no rash, no skin tear. MSK: No muscle spasm, no deformity, no limitation of range of movement in spin Heme: No easy bruising.  Travel history: No recent long distant travel.  Allergy:  Allergies  Allergen Reactions  . Shellfish Allergy Shortness Of Breath and Swelling  . Nitrofurantoin     Other reaction(s): Nausea And Vomiting    Past Medical History:  Diagnosis Date  . Allergy   . Arthritis   . Asthma   . COPD (chronic obstructive pulmonary disease) ([place])   . Depression   . GERD (gastroesophageal reflux disease)   . Glaucoma   . H/O sleep apnea   . History of chicken pox   . History of high blood pressure   . Hyperthyroidism   . Legally blind    color blind  . OSA (obstructive sleep apnea) 08/18/2012  . Urinary incontinence     Past Surgical History:  Procedure Laterality Date  . [place] SURGERY  2011  . BREAST REDUCTION SURGERY    . CARPAL TUNNEL RELEASE Bilateral   . CHOLECYSTECTOMY     with hernia repair  . JOINT REPLACEMENT Left 2012  left knee  . ROTATOR CUFF REPAIR Right 2000  . TUBAL LIGATION  1983    Social History:  reports that she quit smoking about 18 months ago. Her smoking use included cigarettes. She has a 80.00 pack-year smoking history. She has never used smokeless tobacco. She reports that she does not drink alcohol or use drugs.  Family History:  Family History  Problem Relation Age of Onset  . Arthritis Mother   . Hypertension Mother   . Arthritis Father   . Prostate cancer Father   . Hyperlipidemia Father   . Heart disease Father   . Diabetes Maternal  Aunt   . Diabetes Maternal Grandmother   . Hypertension Maternal Grandmother   . Kidney disease Maternal Grandmother   . Heart disease Maternal Grandfather   . Heart disease Paternal Grandfather   . Breast cancer Paternal Grandmother   . Crohn's disease Cousin        1st  . Colon cancer Neg Hx      Prior to Admission medications   Medication Sig Start Date End Date Taking? Authorizing Provider  lisinopril-hydrochlorothiazide (PRINZIDE,ZESTORETIC) 20-12.5 MG tablet Take 1 tablet by mouth daily.   Yes [provider]  ziprasidone (GEODON) 20 MG capsule Take 20 mg by mouth 2 (two) times daily with a meal.   Yes [provider]  albuterol (PROVENTIL) (2.5 MG/3ML) 0.083% nebulizer solution Take 3 mLs (2.5 mg total) by nebulization every 6 (six) hours as needed for wheezing. 07/25/12   Debbrah Alar, NP  bimatoprost (LUMIGAN) 0.01 % SOLN Place 1 drop into both eyes every evening.    [provider]  BRINTELLIX 20 MG TABS Take 20 mg by mouth daily.    [provider]  buPROPion (WELLBUTRIN XL) 150 MG 24 hr tablet Take 1 tablet (150 mg total) by mouth 2 (two) times daily. Patient not taking: Reported on 08/31/2016 09/02/14   Rigoberto Noel, MD  hydrochlorothiazide (HYDRODIURIL) 25 MG tablet Take 1 tablet (25 mg total) by mouth daily. 08/09/14   Debbrah Alar, NP  nicotine (NICOTROL) 10 MG inhaler Inhale 1 cartridge (1 continuous puffing total) into the lungs as needed for smoking cessation. Patient not taking: Reported on 09/01/2014 07/15/14   Parrett, Fonnie Mu, NP  omeprazole (PRILOSEC) 20 MG capsule Take 1 capsule (20 mg total) by mouth daily. Patient not taking: Reported on 08/31/2016 08/09/14   Debbrah Alar, NP  ondansetron (ZOFRAN) 8 MG tablet TAKE 1 TABLET EVERY 8 HOURS AS NEEDED FOR NAUSEA 10/02/14   Debbrah Alar, NP  PROAIR HFA 108 (90 BASE) MCG/ACT inhaler TWO PUFFS EVERY 4 HOURS AS NEEDED 10/04/14   Rigoberto Noel, MD  promethazine  (PHENERGAN) 25 MG tablet Take 25 mg by mouth every 6 (six) hours as needed for nausea or vomiting.    [provider]  SPIRIVA HANDIHALER 18 MCG inhalation capsule INHALE THE CONTENTS OF 1 CAPSULE DAILY 10/03/15   Rigoberto Noel, MD  SYMBICORT 160-4.5 MCG/ACT inhaler USE 2 PUFFS TWICE DAILY 07/12/14   Rigoberto Noel, MD  venlafaxine XR (EFFEXOR-XR) 75 MG 24 hr capsule Take 150 mg by mouth at bedtime.  09/19/12   Debbrah Alar, NP    Physical Exam: Vitals:   01/16/18 0100 01/16/18 0130 01/16/18 0241 01/16/18 0300  BP: 111/81 (!) 127/91  113/89  Pulse: 82 88    Resp: 14 (!) 21    Temp:    97.7 F (36.5 C)  TempSrc:    Oral  SpO2:  97% 98%  97%  Weight:   113.6 kg (250 lb 7.1 oz)   Height:   5\' 4"  (1.626 m)    General: Not in acute distress HEENT:       Eyes: has poor vision, no scleral icterus.       ENT: No discharge from the ears and nose, no pharynx injection, no tonsillar enlargement.        Neck: No JVD, no bruit, no mass felt. Heme: No neck lymph node enlargement. Cardiac: S1/S2, RRR, No murmurs, No gallops or rubs. Respiratory: No rales, wheezing, rhonchi or rubs. GI: Soft, nondistended, nontender, no rebound pain, no organomegaly, BS present. GU: No hematuria Ext: 1+ pitting leg edema bilaterally. 2+DP/PT pulse bilaterally. Musculoskeletal: No joint deformities, No joint redness or warmth, no limitation of ROM in spin. Skin: No rashes.  Neuro: Alert, oriented X3, cranial nerves II-XII grossly intact, moves all extremities normally. Psych: Patient is not psychotic, no suicidal or hemocidal ideation.  Labs on Admission: I have personally reviewed following labs and imaging studies  CBC: Recent Labs  Lab 01/15/18 2051  WBC 14.6*  HGB 13.5  HCT 37.8  MCV 95.5  PLT 295   Basic Metabolic Panel: Recent Labs  Lab 01/15/18 2051  NA 134*  K 4.1  CL 98  CO2 25  GLUCOSE 139*  BUN 34*  CREATININE 1.42*  CALCIUM 9.3   GFR: Estimated Creatinine Clearance:  51.4 mL/min (A) (by C-G formula based on SCr of 1.42 mg/dL (H)). Liver Function Tests: No results for input(s): AST, ALT, ALKPHOS, BILITOT, PROT, ALBUMIN in the last 168 hours. No results for input(s): LIPASE, AMYLASE in the last 168 hours. No results for input(s): AMMONIA in the last 168 hours. Coagulation Profile: No results for input(s): INR, PROTIME in the last 168 hours. Cardiac Enzymes: Recent Labs  Lab 01/15/18 2119  TROPONINI 0.17*   BNP (last 3 results) No results for input(s): PROBNP in the last 8760 hours. HbA1C: No results for input(s): HGBA1C in the last 72 hours. CBG: No results for input(s): GLUCAP in the last 168 hours. Lipid Profile: No results for input(s): CHOL, HDL, LDLCALC, TRIG, CHOLHDL, LDLDIRECT in the last 72 hours. Thyroid Function Tests: No results for input(s): TSH, T4TOTAL, FREET4, T3FREE, THYROIDAB in the last 72 hours. Anemia Panel: No results for input(s): VITAMINB12, FOLATE, FERRITIN, TIBC, IRON, RETICCTPCT in the last 72 hours. Urine analysis:    Component Value Date/Time   COLORURINE YELLOW 08/18/2012 1044   APPEARANCEUR CLEAR 08/18/2012 1044   LABSPEC 1.017 08/18/2012 1044   PHURINE 5.5 08/18/2012 1044   GLUCOSEU NEG 08/18/2012 1044   HGBUR NEG 08/18/2012 1044   BILIRUBINUR NEG 08/18/2012 1044   KETONESUR NEG 08/18/2012 1044   PROTEINUR NEG 08/18/2012 1044   UROBILINOGEN 0.2 08/18/2012 1044   NITRITE NEG 08/18/2012 1044   LEUKOCYTESUR NEG 08/18/2012 1044   Sepsis Labs: @LABRCNTIP (procalcitonin:4,lacticidven:4) )No results found for this or any previous visit (from the past 240 hour(s)).   Radiological Exams on Admission: Dg Chest 2 View  Result Date: 01/15/2018 CLINICAL DATA:  Shortness of breath. Patient reports COPD exacerbation. EXAM: CHEST - 2 VIEW COMPARISON:  Radiograph 03/18/2014 FINDINGS: Chronic hyperinflation and bronchial thickening.The cardiomediastinal contours are unchanged, heart size upper normal. Calcified granuloma  in the right lung apex. Pulmonary vasculature is normal. No consolidation, pleural effusion, or pneumothorax. No acute osseous abnormalities are seen. Surgical anchors in the right shoulder. IMPRESSION: 1. Chronic hyperinflation and bronchial thickening consistent with COPD, unchanged from 2015 exam.  2. No acute findings. Electronically Signed   By: Jeb Levering M.D.   On: 01/15/2018 21:54   Ct Angio Chest Pe W And/or Wo Contrast  Result Date: 01/15/2018 CLINICAL DATA:  Shortness of breath.  Tachycardia and COPD. EXAM: CT ANGIOGRAPHY CHEST WITH CONTRAST TECHNIQUE: Multidetector CT imaging of the chest was performed using the standard protocol during bolus administration of intravenous contrast. Multiplanar CT image reconstructions and MIPs were obtained to evaluate the vascular anatomy. CONTRAST:  46mL ISOVUE-370 IOPAMIDOL (ISOVUE-370) INJECTION 76% COMPARISON:  Radiographs earlier this day. FINDINGS: Cardiovascular: Examination is positive for pulmonary embolus with large thromboembolic burden. Large nearly occlusive thrombus in the left main pulmonary artery extends into the upper and lower lobar branches, near complete occlusion of lingular branches, and rather extensive but incomplete occlusion of lower lobe segmental branches. On the right filling defects in the distal main pulmonary artery extending into all lobar and left lower lobe subsegmental branches. There is straightening of the intraventricular septum with right heart strain, RV to LV ratio of 2.45. thoracic aorta is normal in caliber with minimal atherosclerosis. Heart size upper normal. No pericardial effusion. Mediastinum/Nodes: No adenopathy. Heterogeneous prominent sized thyroid gland with bilateral calcifications consistent with goiter. The esophagus is decompressed. Lungs/Pleura: No evidence pulmonary infarct or focal consolidation. Central bronchial thickening, likely chronic. No pleural effusion or pulmonary edema. No pulmonary mass.  Upper Abdomen: Bariatric surgery partially included. Postcholecystectomy. No acute upper abdominal findings. Musculoskeletal: Degenerative change in the spine. There are no acute or suspicious osseous abnormalities. Review of the MIP images confirms the above findings. IMPRESSION: 1. Positive for acute PE with large clot burden and CT evidence of right heart strain (RV/LV Ratio = 2.45) consistent with at least submassive (intermediate risk) PE. The presence of right heart strain has been associated with an increased risk of morbidity and mortality. Please activate Code PE by paging [phone]. 2. Mild bronchial thickening which is likely chronic. Critical Value/emergent results were called by telephone at the time of interpretation on 01/15/2018 at 11:09 pm to Dr. Sherwood Gambler , who verbally acknowledged these results. Electronically Signed   By: Jeb Levering M.D.   On: 01/15/2018 23:10     EKG: Independently reviewed. Sinus rhythm, QTC 477, low voltage, anteroseptal infarction pattern, poor R wave progression   Assessment/Plan Principal Problem:   Acute pulmonary embolism (HCC) Active Problems:   COPD (chronic obstructive pulmonary disease) (HCC)   GERD (gastroesophageal reflux disease)   HTN (hypertension)   Elevated troponin   Depression   Leukocytosis   Acute renal failure superimposed on stage 2 chronic kidney disease (HCC)   Left ear pain   Acute on chronic respiratory failure with hypoxia (HCC)   Acute on chronic respiratory failure due to acute pulmonary embolism ([place]):  CTA showed acute PE with large clot burden and CT evidence of right heart strain (RV/LV Ratio = 2.45) consistent with at least submassive PE. Currently pt is hemodynamically stable.  Oxygen saturation 98% on 2 L nasal cannula oxygen. PCCM as consulted. EDP "discussed with the ICU, Dr. Lucile Shutters, who feels she is stable for stepdown admission to the hospitalist service and to have a EKOS consult in the  morning".  -admit to stepdown as inp -heparin drip initiated -2D echocardiogram ordered -LE dopplers ordered to evaluate for DVT -pain control: When necessary Percocet and morphine -continue cannula oxygen to maintain oxygen saturation above 93%. -please call IR in AM for possible EKOS  COPD (chronic obstructive pulmonary disease) ([place]): no wheezing  or rhonchi on auscultation.  Does not seem to have COPD exacerbation. -Bronchodilators: DuoNeb nebulizers, Dulera inhaler, PRN albuterol nebulizer  GERD (gastroesophageal reflux disease): -protonix  HTN (hypertension): Blood pressure 127/91 -h Prinzide and HCTZ due to worsening renal function -old IV hydralazine as needed  Elevated troponin: trop 0.17. Likely due to demand ischemia secondary to large PE.  -cycle CE q6 x3 and repeat EKG in the am  - prn Morphine, and aspirin - Risk factor stratification: will check FLP and A1C  - 2d echo  Depression: -Brintellix  AoCKD-II: Baseline Cre is 0.94 on 07/26/14. pt's Cre is 1.42 and BUN 34 on admission.Not very sure if this is due to progression of CKD-II or acute worsening. Pt is on HCTZ and Prinzide, which may have contributed actually - IVF: received 500 cc of NS. - Follow up renal function by BMP - Hold Prinzide and HCTZ  Leukocytosis: no fever or other signs of infection. Likely due to stress induced to demargination -will get blood culture and UA -follow up by CBC  Left ear pain:  Patient states that she has chronic intermittent left ear pain which is not due to infection.  She usually takes Tylenol with good relief.  Today she has recurrent left ear pain without any ear drainage or hearing loss. -prn tylenol    DVT ppx: on IV Heparin    Code Status: Full code Family Communication: None at bed side.     Disposition Plan:  Anticipate discharge back to previous home environment Consults called:  PCCM, Dr. Lucile Shutters Admission status:  SDU/inpation       Date of Service 01/16/2018     Ivor Costa [facility] Pager [phone]  If 7PM-7AM, please contact night-coverage [email or web address] Password [facility] 01/16/2018, 3:44 AM

## 2018-01-16 NOTE — Progress Notes (Signed)
Audrey Lewis is a 62 y.o. female with medical history significant of COPD asthma, hypertension, GERD, morbid obesity, CKD-2, depression, poor vision bilaterally, OSA not on CPAP, who presents with shortness of breath for 3 days.CT angiogram showed large pulmonary embolism with CT evidence of right heart strain.  Patient is admitted to stepdown as inpatient.  PCCM, Dr. Lucile Shutters was consulted.   Assessment:   1. Submassive PE 2. STAGE 2 CKD 3. COPD 4. Hypertension 5. OSA 6. Elevated troponins.    Exam:  Pt alert and comfortable on 2 lit of [place] oxygen.  CVS s1s2, no murmer Lungs air entry fair, no wheezing or rhonchi.  Abdomen : soft non tender non distended bowel sounds heard.  Extremities: no pedal edema.     Plan:  1. IV heparin, echocardiogram and lower extremity venous duplex  2. Discussed with Dr Lamonte Sakai , suggested getting echocardiogram and if there is an indication for thrombolytics to call them.  3. Resume Duo Neb, dulera.  4. Elevated troponins probably from demand ischemia from sub massive PE.    Hosie Poisson, MD [phone]

## 2018-01-16 NOTE — Progress Notes (Signed)
[place] for Heparin Indication: pulmonary embolus  Allergies  Allergen Reactions  . Shellfish Allergy Shortness Of Breath and Swelling  . Nitrofurantoin     Other reaction(s): Nausea And Vomiting    Patient Measurements: Height: 5\' 4"  (162.6 cm) Weight: 250 lb 7.1 oz (113.6 kg) IBW/kg (Calculated) : 54.7 Heparin Dosing Weight: 81.6 kg  Vital Signs: Temp: 98 F (36.7 C) (07/11 1211) Temp Source: Oral (07/11 1211) BP: 117/95 (07/11 1211) Pulse Rate: 95 (07/11 0414)  Labs: Recent Labs    01/15/18 2051 01/15/18 2119 01/16/18 0449 01/16/18 1048  HGB 13.5  --  12.2  --   HCT 37.8  --  35.9*  --   PLT 173  --  164  --   HEPARINUNFRC  --   --  0.58 0.52  CREATININE 1.42*  --  1.33*  --   TROPONINI  --  0.17* 0.10* 0.07*    Estimated Creatinine Clearance: 54.9 mL/min (A) (by C-G formula based on SCr of 1.33 mg/dL (H)).   Medical History: Past Medical History:  Diagnosis Date  . Allergy   . Arthritis   . Asthma   . COPD (chronic obstructive pulmonary disease) ([place])   . Depression   . GERD (gastroesophageal reflux disease)   . Glaucoma   . H/O sleep apnea   . History of chicken pox   . History of high blood pressure   . Hyperthyroidism   . Legally blind    color blind  . OSA (obstructive sleep apnea) 08/18/2012  . Urinary incontinence     Assessment: 62 year old female presented to [place] ED with hypoxia -worsening with exertion, chest soreness. On heparin for acute PE. Last heparin level therapeutic at 0.52. CBC stable.   Goal of Therapy:  Heparin level 0.3-0.7 units/ml Monitor platelets by anticoagulation protocol: Yes   Plan:  Continue heparin gtt at 1,400 units/hr Monitor daily heparin level, CBC, s/s of bleed  Thanks for allowing pharmacy to be a part of this patient's care.  Elenor Quinones, PharmD, BCPS Clinical Pharmacist Phone number [phone] 01/16/2018 12:44 PM

## 2018-01-16 NOTE — Progress Notes (Signed)
[place] for Heparin Indication: pulmonary embolus  Allergies  Allergen Reactions  . Shellfish Allergy Shortness Of Breath and Swelling  . Nitrofurantoin     Other reaction(s): Nausea And Vomiting    Patient Measurements: Height: 5\' 4"  (162.6 cm) Weight: 250 lb 7.1 oz (113.6 kg) IBW/kg (Calculated) : 54.7 Heparin Dosing Weight: 81.6 kg  Vital Signs: Temp: 97.7 F (36.5 C) (07/11 0300) Temp Source: Oral (07/11 0300) BP: 113/89 (07/11 0300) Pulse Rate: 95 (07/11 0414)  Labs: Recent Labs    01/15/18 2051 01/15/18 2119 01/16/18 0449  HGB 13.5  --  12.2  HCT 37.8  --  35.9*  PLT 173  --  164  HEPARINUNFRC  --   --  0.58  CREATININE 1.42*  --  1.33*  TROPONINI  --  0.17* 0.10*    Estimated Creatinine Clearance: 54.9 mL/min (A) (by C-G formula based on SCr of 1.33 mg/dL (H)).   Medical History: Past Medical History:  Diagnosis Date  . Allergy   . Arthritis   . Asthma   . COPD (chronic obstructive pulmonary disease) ([place])   . Depression   . GERD (gastroesophageal reflux disease)   . Glaucoma   . H/O sleep apnea   . History of chicken pox   . History of high blood pressure   . Hyperthyroidism   . Legally blind    color blind  . OSA (obstructive sleep apnea) 08/18/2012  . Urinary incontinence     Assessment: 62 year old female presented to [place] ED with hypoxia -worsening with exertion, chest soreness. CT Angio positive for PE.   Initial heparin level therapeutic at 0.58 units/ml  Goal of Therapy:  Heparin level 0.3-0.7 units/ml Monitor platelets by anticoagulation protocol: Yes   Plan:  Continue heparin drip at 1400 units/hr.  Heparin level in 6 hours to confirm Daily Heparin level and CBC.   Thanks for allowing pharmacy to be a part of this patient's care.  Excell Seltzer, PharmD Clinical Pharmacist

## 2018-01-16 NOTE — Progress Notes (Signed)
*  Preliminary Results* Bilateral lower extremity venous duplex completed. Bilateral lower extremities are negative for deep vein thrombosis. There is no evidence of Baker's cyst bilaterally.  01/16/2018 3:32 PM Maudry Mayhew, BS, RVT, RDCS

## 2018-01-16 NOTE — Plan of Care (Signed)
Was unable to speak with physician at [facility] due to phone system being down.  However, I am assuming that they are calling me to admit patient for the large, sub-massive PE with R heart strain as evidenced by CT scan, trop 0.17.  Looks like they have started heparin gtt on patient.  Have reviewed Dr. Tyron Russell note as well: Dr. Lawanda Cousins SDU admit and EKOS consult in AM.  Have spoken to care link.  Will put in for SDU bed.

## 2018-01-16 NOTE — Progress Notes (Signed)
  Echocardiogram 2D Echocardiogram has been performed.  Audrey Lewis 01/16/2018, 3:59 PM

## 2018-01-17 DIAGNOSIS — I10 Essential (primary) hypertension: Secondary | ICD-10-CM

## 2018-01-17 DIAGNOSIS — J42 Unspecified chronic bronchitis: Secondary | ICD-10-CM

## 2018-01-17 LAB — CBC
HCT: 35.7 % — ABNORMAL LOW (ref 36.0–46.0)
Hemoglobin: 12.1 g/dL (ref 12.0–15.0)
MCH: 33.2 pg (ref 26.0–34.0)
MCHC: 33.9 g/dL (ref 30.0–36.0)
MCV: 98.1 fL (ref 78.0–100.0)
PLATELETS: 195 10*3/uL (ref 150–400)
RBC: 3.64 MIL/uL — AB (ref 3.87–5.11)
RDW: 12.2 % (ref 11.5–15.5)
WBC: 10.8 10*3/uL — ABNORMAL HIGH (ref 4.0–10.5)

## 2018-01-17 LAB — HEPARIN LEVEL (UNFRACTIONATED): Heparin Unfractionated: 0.61 IU/mL (ref 0.30–0.70)

## 2018-01-17 LAB — URINALYSIS, ROUTINE W REFLEX MICROSCOPIC
BACTERIA UA: NONE SEEN
BILIRUBIN URINE: NEGATIVE
Glucose, UA: NEGATIVE mg/dL
HGB URINE DIPSTICK: NEGATIVE
KETONES UR: NEGATIVE mg/dL
NITRITE: NEGATIVE
Protein, ur: NEGATIVE mg/dL
SPECIFIC GRAVITY, URINE: 1.028 (ref 1.005–1.030)
pH: 5 (ref 5.0–8.0)

## 2018-01-17 LAB — BASIC METABOLIC PANEL
Anion gap: 9 (ref 5–15)
BUN: 20 mg/dL (ref 8–23)
CHLORIDE: 104 mmol/L (ref 98–111)
CO2: 23 mmol/L (ref 22–32)
Calcium: 9.4 mg/dL (ref 8.9–10.3)
Creatinine, Ser: 1.21 mg/dL — ABNORMAL HIGH (ref 0.44–1.00)
GFR calc Af Amer: 55 mL/min — ABNORMAL LOW (ref >60)
GFR, EST NON AFRICAN AMERICAN: 47 mL/min — AB (ref >60)
Glucose, Bld: 115 mg/dL — ABNORMAL HIGH (ref 70–99)
POTASSIUM: 4.2 mmol/L (ref 3.5–5.1)
SODIUM: 136 mmol/L (ref 135–145)

## 2018-01-17 LAB — RAPID URINE DRUG SCREEN, HOSP PERFORMED
AMPHETAMINES: NOT DETECTED
Benzodiazepines: POSITIVE — AB
COCAINE: NOT DETECTED
Opiates: NOT DETECTED
Tetrahydrocannabinol: NOT DETECTED

## 2018-01-17 MED ORDER — ASPIRIN 81 MG PO CHEW
81.0000 mg | CHEWABLE_TABLET | Freq: Every day | ORAL | Status: DC
  Administered 2018-01-17 – 2018-01-18 (×2): 81 mg via ORAL
  Filled 2018-01-17 (×2): qty 1

## 2018-01-17 MED ORDER — APIXABAN 5 MG PO TABS
5.0000 mg | ORAL_TABLET | Freq: Two times a day (BID) | ORAL | Status: DC
Start: 2018-01-24 — End: 2018-01-18

## 2018-01-17 MED ORDER — APIXABAN 5 MG PO TABS
10.0000 mg | ORAL_TABLET | Freq: Two times a day (BID) | ORAL | Status: DC
  Administered 2018-01-17 – 2018-01-18 (×3): 10 mg via ORAL
  Filled 2018-01-17 (×4): qty 2

## 2018-01-17 NOTE — Progress Notes (Signed)
Interval PCCM Note  I reviewed TTE results, some mild dilation of the RV with normal function. Based on this and her hemodynamic stability I would not recommend referral to IR for targeted lytics.   Please call us if she clinically changes.   Baltazar Apo, MD, PhD 01/17/2018, 7:21 AM [facility] [phone] or if no answer [phone]

## 2018-01-17 NOTE — Progress Notes (Signed)
PROGRESS NOTE    Audrey Lewis  CBJ:[id number] DOB: 05/15/1956 DOA: 01/15/2018 PCP: Concepcion Elk, MD    Brief Narrative:  Audrey Lewis a 62 y.o.femalewith medical history significant ofCOPD asthma, hypertension, GERD, morbid obesity, CKD-2, depression, poor vision bilaterally, OSA not on CPAP, who presents with shortness of breath for 3 days.CT angiogram showed large pulmonary embolism with CT evidence of right heart strain. Patient is admitted to stepdown as inpatient. PCCM,was consulted.      Assessment & Plan:   Principal Problem:   Acute pulmonary embolism (HCC) Active Problems:   COPD (chronic obstructive pulmonary disease) (HCC)   GERD (gastroesophageal reflux disease)   HTN (hypertension)   Elevated troponin   Depression   Leukocytosis   Acute renal failure superimposed on stage 2 chronic kidney disease (HCC)   Acute respiratory failure with hypoxia (HCC)   Acute respiratory failure with hypoxia secondary to massive pulmonary embolism: Admitted to step down and started on IV heparin . Transition to oral Eliquis today as her heparin levels have been therapeutic. Echocardiogram ordered, showed The cavity size was normal. Wall thickness was increased in a pattern of mild LVH. Systolic function was normal.  The estimated ejection fraction was in the range of 55% to 60%.   Wall motion was normal; there were no regional wall motion abnormalities. Doppler parameters are consistent with abnormal  left ventricular relaxation (grade 1 diastolic dysfunction). - Right ventricle: The cavity size was mildly dilated. Wall thickness was normal. - Pulmonary arteries: Systolic pressure was mildly increased. PA peak pressure: 36 mm Hg. Dr Lamonte Sakai with PCCM reviewed the TTE, recommended no indication of thrombolysis.  - discussed the recommendations and results with the patient and daughter at bedside.  - pt still dyspneic on 3 lit of [place] oxygen on ambulation. Monitor her overnight and plan to  wean her off the oxygen if possible in the next 24 hours.  - check cbc in am.  - venous duplex of the lower extremities is negative for DVT. - she will probably need hematology referral hypercoagulable work up on discharge   Elevated Troponins:  Probably from demand ischemia and not ACS.    AKI On stage 2 CKD:  Suspect pre renal,/ dehydration.  Continues to improve with hydration.  Check renal parameters in am.   Copd: No wheezing heard, resume duoneb treatments and Dulera.    Hypertension Well controlled.    GERD: stable.   Depression resume her home medications.    Leukocytosis:  No signs of infection. Probably reactive. Monitor.    DVT prophylaxis: Eliquis Code Status:  FULL CODE.  Family Communication: daughter at bedside.  Disposition Plan: possible d/c in am if we can wean her off the oxygen.    Consultants:   PCCM CURB SIDE.   Procedures:Echocardiogram.   Antimicrobials: none.   Subjective: Pt reports still significant sob on ambulation. On 3 lit of  Oxygen.   Objective: Vitals:   01/17/18 0801 01/17/18 0802 01/17/18 0803 01/17/18 1204  BP:   119/78 107/72  Pulse:   88 (!) 109  Resp:   16 (!) 22  Temp:  98.1 F (36.7 C) 98.1 F (36.7 C) 98 F (36.7 C)  TempSrc:  Oral Oral Oral  SpO2: 96%  98% 98%  Weight:      Height:        Intake/Output Summary (Last 24 hours) at 01/17/2018 1330 Last data filed at 01/17/2018 0600 Gross per 24 hour  Intake 1040 ml  Output 750 ml  Net 290 ml   Filed Weights   01/15/18 2002 01/16/18 0241  Weight: 112.5 kg (248 lb) 113.6 kg (250 lb 7.1 oz)    Examination:  General exam: Appears calm and comfortable on 3 lit of [place] oxygen.  Respiratory system: Clear to auscultation. Respiratory effort normal. No wheezing or rhonchi.  Cardiovascular system: S1 & S2 heard, RRR. No JVD,  Gastrointestinal system: Abdomen is nondistended, soft and nontender. No organomegaly or masses felt. Normal bowel sounds  heard. Central nervous system: Alert and oriented. No focal neurological deficits. Extremities: Symmetric 5 x 5 power. Skin: No rashes, lesions or ulcers Psychiatry: Judgement and insight appear normal. Mood & affect appropriate.     Data Reviewed: I have personally reviewed following labs and imaging studies  CBC: Recent Labs  Lab 01/15/18 2051 01/16/18 0449 01/17/18 0304  WBC 14.6* 10.9* 10.8*  HGB 13.5 12.2 12.1  HCT 37.8 35.9* 35.7*  MCV 95.5 97.3 98.1  PLT 173 164 950   Basic Metabolic Panel: Recent Labs  Lab 01/15/18 2051 01/16/18 0449 01/17/18 0304  NA 134* 136 136  K 4.1 3.7 4.2  CL 98 100 104  CO2 25 25 23   GLUCOSE 139* 103* 115*  BUN 34* 25* 20  CREATININE 1.42* 1.33* 1.21*  CALCIUM 9.3 9.3 9.4   GFR: Estimated Creatinine Clearance: 60.4 mL/min (A) (by C-G formula based on SCr of 1.21 mg/dL (H)). Liver Function Tests: No results for input(s): AST, ALT, ALKPHOS, BILITOT, PROT, ALBUMIN in the last 168 hours. No results for input(s): LIPASE, AMYLASE in the last 168 hours. No results for input(s): AMMONIA in the last 168 hours. Coagulation Profile: No results for input(s): INR, PROTIME in the last 168 hours. Cardiac Enzymes: Recent Labs  Lab 01/15/18 2119 01/16/18 0449 01/16/18 1048  TROPONINI 0.17* 0.10* 0.07*   BNP (last 3 results) No results for input(s): PROBNP in the last 8760 hours. HbA1C: Recent Labs    01/16/18 0449  HGBA1C 4.5*   CBG: No results for input(s): GLUCAP in the last 168 hours. Lipid Profile: Recent Labs    01/16/18 0449  CHOL 169  HDL 93  LDLCALC 69  TRIG 35  CHOLHDL 1.8   Thyroid Function Tests: No results for input(s): TSH, T4TOTAL, FREET4, T3FREE, THYROIDAB in the last 72 hours. Anemia Panel: No results for input(s): VITAMINB12, FOLATE, FERRITIN, TIBC, IRON, RETICCTPCT in the last 72 hours. Sepsis Labs: No results for input(s): PROCALCITON, LATICACIDVEN in the last 168 hours.  Recent Results (from the past  240 hour(s))  MRSA PCR Screening     Status: None   Collection Time: 01/16/18  3:00 AM  Result Value Ref Range Status   MRSA by PCR NEGATIVE NEGATIVE Final    Comment:        The GeneXpert MRSA Assay (FDA approved for NASAL specimens only), is one component of a comprehensive MRSA colonization surveillance program. It is not intended to diagnose MRSA infection nor to guide or monitor treatment for MRSA infections. Performed at [facility], [place] [address] [place], [place] [address]   Culture, blood (Routine X 2) w Reflex to ID Panel     Status: None (Preliminary result)   Collection Time: 01/16/18 10:48 AM  Result Value Ref Range Status   Specimen Description BLOOD BLOOD RIGHT HAND  Final   Special Requests   Final    BOTTLES DRAWN AEROBIC AND ANAEROBIC Blood Culture results may not be optimal due to an inadequate volume of blood received in  culture bottles   Culture   Final    NO GROWTH 1 DAY Performed at [facility], [place] [address] [place], [place] [address]    Report Status PENDING  Incomplete  Culture, blood (Routine X 2) w Reflex to ID Panel     Status: None (Preliminary result)   Collection Time: 01/16/18 11:00 AM  Result Value Ref Range Status   Specimen Description BLOOD BLOOD LEFT HAND  Final   Special Requests   Final    BOTTLES DRAWN AEROBIC AND ANAEROBIC Blood Culture results may not be optimal due to an inadequate volume of blood received in culture bottles   Culture   Final    NO GROWTH 1 DAY Performed at [facility], [place] [address], [place], [place] [address]    Report Status PENDING  Incomplete         Radiology Studies: Dg Chest 2 View  Result Date: 01/15/2018 CLINICAL DATA:  Shortness of breath. Patient reports COPD exacerbation. EXAM: CHEST - 2 VIEW COMPARISON:  Radiograph 03/18/2014 FINDINGS: Chronic hyperinflation and bronchial thickening.The cardiomediastinal contours are unchanged, heart size upper normal. Calcified  granuloma in the right lung apex. Pulmonary vasculature is normal. No consolidation, pleural effusion, or pneumothorax. No acute osseous abnormalities are seen. Surgical anchors in the right shoulder. IMPRESSION: 1. Chronic hyperinflation and bronchial thickening consistent with COPD, unchanged from 2015 exam. 2. No acute findings. Electronically Signed   By: Jeb Levering M.D.   On: 01/15/2018 21:54   Ct Angio Chest Pe W And/or Wo Contrast  Result Date: 01/15/2018 CLINICAL DATA:  Shortness of breath.  Tachycardia and COPD. EXAM: CT ANGIOGRAPHY CHEST WITH CONTRAST TECHNIQUE: Multidetector CT imaging of the chest was performed using the standard protocol during bolus administration of intravenous contrast. Multiplanar CT image reconstructions and MIPs were obtained to evaluate the vascular anatomy. CONTRAST:  41mL ISOVUE-370 IOPAMIDOL (ISOVUE-370) INJECTION 76% COMPARISON:  Radiographs earlier this day. FINDINGS: Cardiovascular: Examination is positive for pulmonary embolus with large thromboembolic burden. Large nearly occlusive thrombus in the left main pulmonary artery extends into the upper and lower lobar branches, near complete occlusion of lingular branches, and rather extensive but incomplete occlusion of lower lobe segmental branches. On the right filling defects in the distal main pulmonary artery extending into all lobar and left lower lobe subsegmental branches. There is straightening of the intraventricular septum with right heart strain, RV to LV ratio of 2.45. thoracic aorta is normal in caliber with minimal atherosclerosis. Heart size upper normal. No pericardial effusion. Mediastinum/Nodes: No adenopathy. Heterogeneous prominent sized thyroid gland with bilateral calcifications consistent with goiter. The esophagus is decompressed. Lungs/Pleura: No evidence pulmonary infarct or focal consolidation. Central bronchial thickening, likely chronic. No pleural effusion or pulmonary edema. No  pulmonary mass. Upper Abdomen: Bariatric surgery partially included. Postcholecystectomy. No acute upper abdominal findings. Musculoskeletal: Degenerative change in the spine. There are no acute or suspicious osseous abnormalities. Review of the MIP images confirms the above findings. IMPRESSION: 1. Positive for acute PE with large clot burden and CT evidence of right heart strain (RV/LV Ratio = 2.45) consistent with at least submassive (intermediate risk) PE. The presence of right heart strain has been associated with an increased risk of morbidity and mortality. Please activate Code PE by paging [phone]. 2. Mild bronchial thickening which is likely chronic. Critical Value/emergent results were called by telephone at the time of interpretation on 01/15/2018 at 11:09 pm to Dr. Sherwood Gambler , who verbally acknowledged these results. Electronically  Signed   By: Jeb Levering M.D.   On: 01/15/2018 23:10        Scheduled Meds: . apixaban  10 mg Oral BID   Followed by  . [START ON 01/24/2018] apixaban  5 mg Oral BID  . aspirin  81 mg Oral Daily  . ipratropium-albuterol  3 mL Nebulization BID  . latanoprost  1 drop Both Eyes QHS  . mouth rinse  15 mL Mouth Rinse BID  . mometasone-formoterol  2 puff Inhalation BID  . venlafaxine XR  150 mg Oral QHS  . vortioxetine HBr  20 mg Oral Daily  . ziprasidone  20 mg Oral BID WC   Continuous Infusions:   LOS: 1 day    Time spent: 35 minutes.     Hosie Poisson, MD [facility] Pager [phone]   If 7PM-7AM, please contact night-coverage [email or web address] Password [email or web address] 01/17/2018, 1:30 PM

## 2018-01-17 NOTE — Care Management Note (Addendum)
Case Management Note  Patient Details  Name: Audrey Lewis MRN: [id number] Date of Birth: September 11, 1955  Subjective/Objective:   From home alone, pta indep, presents with pe, will be on eliquis, NCM gave patient the 30 day savings coupon and the 10.00 co pay card. Patient will also need home oxygen, NCM spoke with patient about the oxygen, Patient would like to work with [facility], NCM notified MD to put order in for home oxygen.  Patient will also follow up with outpt cardiac rehab per physical therapy note.    NCM informed [name] about the oxygen, just waiting on MD to put oxygen order in .               Action/Plan: DC home when ready.  Expected Discharge Date:                  Expected Discharge Plan:  Home/Self Care  In-House Referral:     Discharge planning Services  CM Consult, Medication Assistance  Post Acute Care Choice:    Choice offered to:     DME Arranged:   Oxygen, 3 n 1  DME Agency:   [place]:    HH Agency:     Status of Service:  Completed, signed off  If discussed at [place] of Stay Meetings, dates discussed:    Additional Comments:  Zenon Mayo, RN 01/17/2018, 3:05 PM

## 2018-01-17 NOTE — Discharge Instructions (Addendum)
1) talk to your cardiologist about when you can start your cardiac rehab program 2) while on apixaban/Eliquis for blood clots please Avoid ibuprofen/Advil/Aleve/Motrin/Goody Powders/Naproxen/BC powders/Meloxicam/Diclofenac/Indomethacin and other Nonsteroidal anti-inflammatory medications as these will make you more likely to bleed and can cause stomach ulcers, can also cause Kidney problems.  3) repeat CBC and BMP blood test with your PCP/Primary care doctor in 3 to 5 days 4) use oxygen via nasal cannula all the time as advised  Please note  You were cared for by a hospitalist Physician during your hospital stay. Once you are discharged, your primary care physician will handle any further medical issues. Please note that NO REFILLS for any discharge medications will be authorized once you are discharged, as it is imperative that you return to your primary care physician (or establish a relationship with a primary care physician if you do not have one) for your aftercare needs so that they can reassess your need for medications and monitor your lab values    Information on my medicine - ELIQUIS (apixaban)  Why was Eliquis prescribed for you? Eliquis was prescribed to treat blood clots that may have been found in the veins of your legs (deep vein thrombosis) or in your lungs (pulmonary embolism) and to reduce the risk of them occurring again.  What do You need to know about Eliquis ? The starting dose is 10 mg (two 5 mg tablets) taken TWICE daily for the FIRST SEVEN (7) DAYS, then on 7/19 the dose is reduced to ONE 5 mg tablet taken TWICE daily.  Eliquis may be taken with or without food.   Try to take the dose about the same time in the morning and in the evening. If you have difficulty swallowing the tablet whole please discuss with your pharmacist how to take the medication safely.  Take Eliquis exactly as prescribed and DO NOT stop taking Eliquis without talking to the doctor who  prescribed the medication.  Stopping may increase your risk of developing a new blood clot.  Refill your prescription before you run out.  After discharge, you should have regular check-up appointments with your healthcare provider that is prescribing your Eliquis.    What do you do if you miss a dose? If a dose of ELIQUIS is not taken at the scheduled time, take it as soon as possible on the same day and twice-daily administration should be resumed. The dose should not be doubled to make up for a missed dose.  Important Safety Information A possible side effect of Eliquis is bleeding. You should call your healthcare provider right away if you experience any of the following: ? Bleeding from an injury or your nose that does not stop. ? Unusual colored urine (red or dark brown) or unusual colored stools (red or black). ? Unusual bruising for unknown reasons. ? A serious fall or if you hit your head (even if there is no bleeding).  Some medicines may interact with Eliquis and might increase your risk of bleeding or clotting while on Eliquis. To help avoid this, consult your healthcare provider or pharmacist prior to using any new prescription or non-prescription medications, including herbals, vitamins, non-steroidal anti-inflammatory drugs (NSAIDs) and supplements.  This website has more information on Eliquis (apixaban): http://www.eliquis.com/eliquis/home  1) talk to your cardiologist about when you can start your cardiac rehab program 2) while on apixaban/Eliquis for blood clots please Avoid ibuprofen/Advil/Aleve/Motrin/Goody Powders/Naproxen/BC powders/Meloxicam/Diclofenac/Indomethacin and other Nonsteroidal anti-inflammatory medications as these will make you more likely to bleed  and can cause stomach ulcers, can also cause Kidney problems.  3) repeat CBC and BMP blood test with your PCP/Primary care doctor in 3 to 5 days 4) use oxygen via nasal cannula all the time as  advised  Please note  You were cared for by a hospitalist Physician during your hospital stay. Once you are discharged, your primary care physician will handle any further medical issues. Please note that NO REFILLS for any discharge medications will be authorized once you are discharged, as it is imperative that you return to your primary care physician (or establish a relationship with a primary care physician if you do not have one) for your aftercare needs so that they can reassess your need for medications and monitor your lab values

## 2018-01-17 NOTE — Progress Notes (Addendum)
[place] for Heparin Indication: pulmonary embolus  Allergies  Allergen Reactions  . Shellfish Allergy Shortness Of Breath and Swelling  . Nitrofurantoin     Other reaction(s): Nausea And Vomiting    Patient Measurements: Height: 5\' 4"  (162.6 cm) Weight: 250 lb 7.1 oz (113.6 kg) IBW/kg (Calculated) : 54.7 Heparin Dosing Weight: 81.6 kg  Vital Signs: Temp: 98.2 F (36.8 C) (07/12 0500) Temp Source: Oral (07/12 0500) BP: 122/81 (07/12 0500) Pulse Rate: 86 (07/12 0500)  Labs: Recent Labs    01/15/18 2051 01/15/18 2119 01/16/18 0449 01/16/18 1048 01/17/18 0304  HGB 13.5  --  12.2  --  12.1  HCT 37.8  --  35.9*  --  35.7*  PLT 173  --  164  --  195  HEPARINUNFRC  --   --  0.58 0.52 0.61  CREATININE 1.42*  --  1.33*  --  1.21*  TROPONINI  --  0.17* 0.10* 0.07*  --     Estimated Creatinine Clearance: 60.4 mL/min (A) (by C-G formula based on SCr of 1.21 mg/dL (H)).   Medical History: Past Medical History:  Diagnosis Date  . Allergy   . Arthritis   . Asthma   . COPD (chronic obstructive pulmonary disease) ([place])   . Depression   . GERD (gastroesophageal reflux disease)   . Glaucoma   . H/O sleep apnea   . History of chicken pox   . History of high blood pressure   . Hyperthyroidism   . Legally blind    color blind  . OSA (obstructive sleep apnea) 08/18/2012  . Urinary incontinence     Assessment: 62 year old female presented to [facility] ED with hypoxia -worsening with exertion, chest soreness. On heparin for acute PE. Last heparin level therapeutic at 0.61. CBC stable.  Goal of Therapy:  Heparin level 0.3-0.7 units/ml Monitor platelets by anticoagulation protocol: Yes   Plan:  Continue heparin gtt at 1,400 units/hr Monitor daily heparin level, CBC, s/s of bleed  ADDENDUM: Now to transition to Eliquis for PE treatment.  Stop heparin gtt when first dose of Eliquis given Start Eliquis 10mg  PO BID x 7  days, then start 5mg  PO BID Decrease aspirin 324mg  to 81mg  daily Monitor  CBC, s/s of bleed   Thanks for allowing pharmacy to be a part of this patient's care.  Elenor Quinones, PharmD, BCPS Clinical Pharmacist Phone number [phone] 01/17/2018 7:48 AM

## 2018-01-18 DIAGNOSIS — I2699 Other pulmonary embolism without acute cor pulmonale: Secondary | ICD-10-CM

## 2018-01-18 LAB — CBC
HCT: 33.9 % — ABNORMAL LOW (ref 36.0–46.0)
HEMOGLOBIN: 11.6 g/dL — AB (ref 12.0–15.0)
MCH: 33.8 pg (ref 26.0–34.0)
MCHC: 34.2 g/dL (ref 30.0–36.0)
MCV: 98.8 fL (ref 78.0–100.0)
PLATELETS: 227 10*3/uL (ref 150–400)
RBC: 3.43 MIL/uL — AB (ref 3.87–5.11)
RDW: 12.5 % (ref 11.5–15.5)
WBC: 11 10*3/uL — ABNORMAL HIGH (ref 4.0–10.5)

## 2018-01-18 MED ORDER — APIXABAN 5 MG PO TABS
10.0000 mg | ORAL_TABLET | Freq: Once | ORAL | Status: AC
  Administered 2018-01-18: 10 mg via ORAL

## 2018-01-18 MED ORDER — ELIQUIS 5 MG VTE STARTER PACK: ORAL_TABLET | ORAL | 0 refills | Status: AC

## 2018-01-18 MED ORDER — OMEPRAZOLE 20 MG PO CPDR
20.0000 mg | DELAYED_RELEASE_CAPSULE | Freq: Every day | ORAL | 1 refills | Status: AC
Start: 2018-01-18 — End: ?

## 2018-01-18 MED ORDER — LISINOPRIL-HYDROCHLOROTHIAZIDE 20-12.5 MG PO TABS: 1.0000 | ORAL_TABLET | Freq: Every day | ORAL | 1 refills | Status: AC

## 2018-01-18 MED ORDER — ALBUTEROL SULFATE HFA 108 (90 BASE) MCG/ACT IN AERS: INHALATION_SPRAY | RESPIRATORY_TRACT | 2 refills | Status: AC

## 2018-01-18 MED ORDER — BUDESONIDE-FORMOTEROL FUMARATE 160-4.5 MCG/ACT IN AERO: 2.0000 | INHALATION_SPRAY | Freq: Two times a day (BID) | RESPIRATORY_TRACT | 2 refills | Status: AC

## 2018-01-18 MED ORDER — HYDROCODONE-ACETAMINOPHEN 5-325 MG PO TABS: 1.0000 | ORAL_TABLET | ORAL | 0 refills | Status: AC | PRN

## 2018-01-18 NOTE — Evaluation (Addendum)
Physical Therapy Evaluation & Discharge Patient Details Name: Audrey Lewis MRN: [id number] DOB: Oct 02, 1955 Today's Date: 01/18/2018   History of Present Illness  Pt is a 62 y.o. female admitted 01/15/18 with progressive SOB. CT angio showed large PE with eveidence of R heart strain. PMH includes COPD, asthma, HTN, obesity, CKDII, depression, OSA, legally blind.    Clinical Impression  Patient evaluated by Physical Therapy with no further acute PT needs identified. PTA, pt lives alone and indep; legally blind, so daughter drives. Today, pt indep with ambulation, mainly limited by decreased activity tolerance and significant DOE limited to ambulating 20-30' before requiring rest break. SpO2 >90% on 2L O2  (see O2 saturation qualifications note). Recommend use of rollator at home for energy conservation; pt could benefit from 3-in-1 to use as shower seat. Educ on this and fall risk reduction. All education has been completed and the patient has no further questions. PT is signing off. Thank you for this referral.    Follow Up Recommendations (Outpatient Cardiac Rehab)    Equipment Recommendations  3in1 (PT)    Recommendations for Other Services       Precautions / Restrictions Precautions Precautions: None Restrictions Weight Bearing Restrictions: No      Mobility  Bed Mobility Overal bed mobility: Independent                Transfers Overall transfer level: Independent                  Ambulation/Gait Ambulation/Gait assistance: Independent Gait Distance (Feet): 70 Feet Assistive device: None Gait Pattern/deviations: Step-through pattern;Decreased stride length;Wide base of support Gait velocity: Decreased   General Gait Details: Amb 30' + seated rest + 20' + seated rest + 20'. Pt with DOE 3/4; indep but cues to rest as needed due to significant WOB  Stairs            Wheelchair Mobility    Modified Rankin (Stroke Patients Only)       Balance  Overall balance assessment: No apparent balance deficits (not formally assessed)                                           Pertinent Vitals/Pain Pain Assessment: No/denies pain    Home Living Family/patient expects to be discharged to:: Private residence Living Arrangements: Alone Available Help at Discharge: Family;Friend(s);Available PRN/intermittently Type of Home: House Home Access: Level entry     Home Layout: One level Home Equipment: Walker - 4 wheels      Prior Function Level of Independence: Independent         Comments: Does not drive since legally blind; daughter drives for errands/appointments     Hand Dominance        Extremity/Trunk Assessment   Upper Extremity Assessment Upper Extremity Assessment: Overall WFL for tasks assessed    Lower Extremity Assessment Lower Extremity Assessment: Overall WFL for tasks assessed       Communication   Communication: No difficulties  Cognition Arousal/Alertness: Awake/alert Behavior During Therapy: WFL for tasks assessed/performed Overall Cognitive Status: Within Functional Limits for tasks assessed                                        General Comments      Exercises  Assessment/Plan    PT Assessment Patent does not need any further PT services  PT Problem List         PT Treatment Interventions      PT Goals (Current goals can be found in the Care Plan section)  Acute Rehab PT Goals Patient Stated Goal: Breathe better PT Goal Formulation: All assessment and education complete, DC therapy    Frequency     Barriers to discharge        Co-evaluation               AM-PAC PT "6 Clicks" Daily Activity  Outcome Measure Difficulty turning over in bed (including adjusting bedclothes, sheets and blankets)?: None Difficulty moving from lying on back to sitting on the side of the bed? : None Difficulty sitting down on and standing up from a chair with  arms (e.g., wheelchair, bedside commode, etc,.)?: None Help needed moving to and from a bed to chair (including a wheelchair)?: None Help needed walking in hospital room?: None Help needed climbing 3-5 steps with a railing? : A Little 6 Click Score: 23    End of Session Equipment Utilized During Treatment: Gait belt;Oxygen Activity Tolerance: Patient tolerated treatment well;Patient limited by fatigue Patient left: in bed;with call bell/phone within reach Nurse Communication: Mobility status PT Visit Diagnosis: Other abnormalities of gait and mobility (R26.89)    Time: 7493-5521 PT Time Calculation (min) (ACUTE ONLY): 19 min   Charges:   PT Evaluation $PT Eval Moderate Complexity: 1 Mod     PT G Codes:       Mabeline Caras, PT, DPT Acute Rehab Services  Pager: [place] 01/18/2018, 8:26 AM

## 2018-01-18 NOTE — Plan of Care (Signed)
Pt up with PT today, ambulated in hall.  SOB afterwards but recovered well.  Appetite good.

## 2018-01-18 NOTE — Progress Notes (Signed)
SATURATION QUALIFICATIONS: (This note is used to comply with regulatory documentation for home oxygen)  Patient Saturations on Room Air at Rest = 88%  Patient Saturations on Room Air while Ambulating = 80%  Patient Saturations on 2 Liters of oxygen while Ambulating = 91%  Please briefly explain why patient needs home oxygen: Pt unable to maintain SpO2 saturations >90% while ambulating on room air.  Mabeline Caras, PT, DPT Acute Rehab Services  Pager: [phone]

## 2018-01-18 NOTE — Discharge Summary (Signed)
Audrey Lewis, Lewis a 62 y.o. female  DOB 1956-04-17  MRN [id number].  Admission date:  01/15/2018  Admitting Physician  Ivor Costa, MD  Discharge Date:  01/18/2018   Primary MD  Concepcion Elk, MD  Recommendations for primary care physician for things to follow:   1) talk to your cardiologist about when you can start your cardiac rehab program 2) while on apixaban/Eliquis for blood clots please Avoid ibuprofen/Advil/Aleve/Motrin/Goody Powders/Naproxen/BC powders/Meloxicam/Diclofenac/Indomethacin and other Nonsteroidal anti-inflammatory medications as these will make you more likely to bleed and can cause stomach ulcers, can also cause Kidney problems.  3) repeat CBC and BMP blood test with your PCP/Primary care doctor in 3 to 5 days 4) use oxygen via nasal cannula all the time as advised   Admission Diagnosis  Acute saddle pulmonary embolism with acute cor pulmonale (HCC) [I26.02]   Discharge Diagnosis  Acute saddle pulmonary embolism with acute cor pulmonale (HCC) [I26.02]   Principal Problem:   Acute pulmonary embolism (HCC) Active Problems:   COPD (chronic obstructive pulmonary disease) (HCC)   Audrey Lewis (gastroesophageal reflux disease)   HTN (Audrey Lewis)   Elevated troponin   Audrey Lewis   Leukocytosis   Acute renal failure superimposed on stage 2 chronic kidney disease (HCC)   Left ear pain   Acute on chronic respiratory failure with hypoxia (HCC)      Past Medical History:  Diagnosis Date  . Allergy   . Arthritis   . Lewis   . COPD (chronic obstructive pulmonary disease) ([place])   . Audrey Lewis   . Audrey Lewis (gastroesophageal reflux disease)   . Glaucoma   . H/O sleep apnea   . History of chicken pox   . History of high blood pressure   . Hyperthyroidism   . Legally blind    color blind  . Audrey (obstructive sleep apnea) 08/18/2012  . Urinary incontinence     Past Surgical History:  Procedure  Laterality Date  . [place] SURGERY  2011  . BREAST REDUCTION SURGERY    . CARPAL TUNNEL RELEASE Bilateral   . CHOLECYSTECTOMY     with hernia repair  . JOINT REPLACEMENT Left 2012   left knee  . ROTATOR CUFF REPAIR Right 2000  . TUBAL LIGATION  1983       HPI  from the history and physical done on the day of admission:   Patient coming from:  The patient Lewis coming from home.  At baseline, pt Lewis independent for most of ADL.   Chief Complaint: SOB  HPI: Audrey Lewis Lewis a 62 y.o. female with medical history significant of COPD Lewis, Audrey Lewis, Audrey Lewis, Audrey Lewis, Audrey Lewis, Audrey Lewis, Audrey Lewis, Audrey Lewis, Audrey presents with shortness of breath.  Patient states that she has been having shortness of breath in the past 3 days, which has been progressively getting worse.  Shortness of breath Lewis aggravated by even mild exertion.  She has some mild dry cough, but no fever or chills.  She states that she has mild chest pain  which Lewis located in the lower chest and epigastric area, sharp, nonradiating.  Patient denies recent long distance traveling.  No tenderness in the calf area.  Per report, patient had oxygen desaturation to 86% on room air.  She states that she almost passed out in shower, but did not.  No unilateral weakness, numbness or tingling his extremities.  No hearing loss.  She has chronic Audrey vision problem which has not changed.  Patient has some nausea, but no vomiting, diarrhea or abdominal pain.  No symptoms of UTI.  Patient states that she has chronic intermittent left ear pain which Lewis not due to infection.  She usually takes Tylenol with good relief.  Today she has recurrent left ear pain without any ear drainage or hearing loss.  ED Course: pt was found to have WBC 14.6, BNP 475, troponin 0 0.17, worsening renal function, temperature normal, heart rate 80s, tachypnea, chest x-ray showed COPD.  CT angiogram showed large pulmonary embolism with CT  evidence of right heart strain.  Patient Lewis admitted to stepdown as inpatient.  PCCM, Dr. Lucile Shutters was consulted.     Hospital Course:    Principal Problem:   Acute pulmonary embolism (HCC) Active Problems:   COPD (chronic obstructive pulmonary disease) (HCC)   Audrey Lewis (gastroesophageal reflux disease)   HTN (Audrey Lewis)   Elevated troponin   Audrey Lewis   Leukocytosis   Acute renal failure superimposed on stage 2 chronic kidney disease (HCC)   Acute respiratory failure with hypoxia ([place])    Brief Narrative:  Audrey Lewis, Audrey Lewis, Audrey Lewis, Audrey Lewis, Audrey Lewis, Audrey Lewis, Audrey Lewis, Audrey Lewis, Audrey Lewis admitted to stepdown as inpatient. PCCM,was consulted, they recommended no thrombolytics   Plan:-  1)acute hypoxic respiratory failure secondary COPD and superimposed submassive pulmonary embolism -hypoxia improved significantly during his hospital stay, but some degree of hypoxia persist as noted below, will discharge home on home O2.  use home oxygen continuously via nasal cannula at 2 L/min with gaseous portability and conserving device due to persistent hypoxia Patient Saturations on Room Air at Rest = 88% Patient Saturations on Room Air while Ambulating = 80% Patient Saturations on 2 Liters of oxygen while Ambulating = 91% Please briefly explain why patient needs home oxygen: Pt unable to maintain SpO2 saturations >90% while ambulating on room air.  2) acute submassive pulmonary embolism with mild right heart strain--- Dr. Lamonte Sakai from [facility] service advised against thrombolytics, echo noted, lower extremity Dopplers without DVT, treated initially with IV heparin transition to p.o. apixaban on 01/17/2018, above patient's hypoxia improved but did not resolve  patient Lewis being discharged home on home O2.  Her underlying COPD Lewis probably contributing to her hypoxia  3)AKI----acute kidney injury on CKD stage - II     creatinine on admission=1.42  ,   baseline creatinine = 0.9    , creatinine Lewis now= 1.21    ,  Avoid nephrotoxic agents/dehydration/hypotension  4)HTN--- stable, okay to resume lisinopril/HCTZ,  5)COPD--- now with worsening respiratory symptoms and hypoxia partly due to acute PE, bronchodilators and supplemental oxygen as ordered  Discharge Condition: stable  Follow UP  [place] Follow up.   Why:  home oxygen, 3 n 1  Contact information: [address] [place] [address] [phone]  Consults obtained - PCCM  Diet and Activity recommendation:  As advised  Discharge Instructions    Discharge Instructions    Call MD for:  difficulty breathing, headache or visual disturbances   Complete by:  As directed    Call MD for:  persistant dizziness or light-headedness   Complete by:  As directed    Call MD for:  persistant nausea and vomiting   Complete by:  As directed    Call MD for:  redness, tenderness, or signs of infection (pain, swelling, redness, odor or green/yellow discharge around incision site)   Complete by:  As directed    Call MD for:  severe uncontrolled pain   Complete by:  As directed    Call MD for:  temperature >100.4   Complete by:  As directed    Diet - low sodium heart healthy   Complete by:  As directed    Discharge instructions   Complete by:  As directed    1) talk to your cardiologist about when you can start your cardiac rehab program 2) while on apixaban/Eliquis for blood clots please Avoid ibuprofen/Advil/Aleve/Motrin/Goody Powders/Naproxen/BC powders/Meloxicam/Diclofenac/Indomethacin and other Nonsteroidal anti-inflammatory medications as these will make you more likely to bleed and can cause stomach ulcers, can also cause Kidney  problems.  3) repeat CBC and BMP blood test with your PCP/Primary care doctor in 3 to 5 days 4) use oxygen via nasal cannula all the time as advised  Please note  You were cared for by a hospitalist Physician during your hospital stay. Once you are discharged, your primary care physician will handle any further medical issues. Please note that NO REFILLS for any discharge medications will be authorized once you are discharged, as it Lewis imperative that you return to your primary care physician (or establish a relationship with a primary care physician if you do not have one) for your aftercare needs so that they can reassess your need for medications and monitor your lab values   Increase activity slowly   Complete by:  As directed         Discharge Medications     Allergies as of 01/18/2018      Reactions   Shellfish Allergy Shortness Of Breath, Swelling   Nitrofurantoin Nausea And Vomiting      Medication List    STOP taking these medications   PROLASTIN IV     TAKE these medications   albuterol (2.5 MG/3ML) 0.083% nebulizer solution Commonly known as:  PROVENTIL Take 3 mLs (2.5 mg total) by nebulization every 6 (six) hours as needed for wheezing.   albuterol 108 (90 Base) MCG/ACT inhaler Commonly known as:  PROAIR HFA TWO PUFFS EVERY 4 HOURS AS NEEDED for shortness of breath   ALPRAZolam 0.5 MG tablet Commonly known as:  XANAX Take 0.5 mg by mouth 2 (two) times daily as needed for anxiety.   BRINTELLIX 20 MG Tabs tablet Generic drug:  vortioxetine HBr Take 20 mg by mouth daily.   budesonide-formoterol 160-4.5 MCG/ACT inhaler Commonly known as:  SYMBICORT Inhale 2 puffs into the lungs 2 (two) times daily. What changed:  See the new instructions.   ELIQUIS STARTER PACK 5 MG Tabs Take as directed on package: start with two-5mg  tablets twice daily for 7 days. On day 8, switch to one-5mg  tablet twice daily.   HYDROcodone-acetaminophen 5-325 MG tablet Commonly known  as:  NORCO Take 1 tablet by mouth every 4 (four) hours as needed for moderate pain.   lisinopril-hydrochlorothiazide  20-12.5 MG tablet Commonly known as:  PRINZIDE,ZESTORETIC Take 1 tablet by mouth daily. Start on Monday, 01/20/2018 What changed:  additional instructions   LUMIGAN 0.01 % Soln Generic drug:  bimatoprost Place 1 drop into both eyes every evening.   omeprazole 20 MG capsule Commonly known as:  PRILOSEC Take 1 capsule (20 mg total) by mouth daily.   ondansetron 8 MG tablet Commonly known as:  ZOFRAN TAKE 1 TABLET EVERY 8 HOURS AS NEEDED FOR NAUSEA   SPIRIVA HANDIHALER 18 MCG inhalation capsule Generic drug:  tiotropium INHALE THE CONTENTS OF 1 CAPSULE DAILY   venlafaxine XR 75 MG 24 hr capsule Commonly known as:  EFFEXOR-XR Take 150 mg by mouth at bedtime.   ziprasidone 20 MG capsule Commonly known as:  GEODON Take 20 mg by mouth 2 (two) times daily with a meal.            Durable Medical Equipment  (From admission, onward)        Start     Ordered   01/18/18 1728  DME 3-in-1  Once    Comments:  durable medical equipment   01/18/18 1730   01/18/18 1404  For home use only DME 3 n 1  Once     01/18/18 1404   01/18/18 1151  For home use only DME oxygen  Once    Comments:  Use home oxygen continuously via nasal cannula at 2 L/min with gaseous portability and conserving device due to persistent hypoxia    Patient Saturations on Room Air at Rest = 88%  Patient Saturations on [employer] while Ambulating = 80%  Patient Saturations on 2 Liters of oxygen while Ambulating = 91%  Please briefly explain why patient needs home oxygen: Pt unable to maintain SpO2 saturations >90% while ambulating on room air.  Question Answer Comment  Mode or (Route) Nasal cannula   Liters per Minute 2   Frequency Continuous (stationary and portable oxygen unit needed)   Oxygen conserving device Yes   Oxygen delivery system Gas      01/18/18 1151      Major  procedures and Radiology Reports - PLEASE review detailed and final reports for all details, in brief -   Dg Chest 2 View  Result Date: 01/15/2018 CLINICAL DATA:  Shortness of breath. Patient reports COPD exacerbation. EXAM: CHEST - 2 VIEW COMPARISON:  Radiograph 03/18/2014 FINDINGS: Chronic hyperinflation and bronchial thickening.The cardiomediastinal contours are unchanged, heart size upper normal. Calcified granuloma in the right lung apex. Pulmonary vasculature Lewis normal. No consolidation, pleural effusion, or pneumothorax. No acute osseous abnormalities are seen. Surgical anchors in the right shoulder. IMPRESSION: 1. Chronic hyperinflation and bronchial thickening consistent with COPD, unchanged from 2015 exam. 2. No acute findings. Electronically Signed   By: Jeb Levering M.D.   On: 01/15/2018 21:54   Ct Angio Chest Pe W And/or Wo Contrast  Result Date: 01/15/2018 CLINICAL DATA:  Shortness of breath.  Tachycardia and COPD. EXAM: CT ANGIOGRAPHY CHEST WITH CONTRAST TECHNIQUE: Multidetector CT imaging of the chest was performed using the standard protocol during bolus administration of intravenous contrast. Multiplanar CT image reconstructions and MIPs were obtained to evaluate the vascular anatomy. CONTRAST:  97mL ISOVUE-370 IOPAMIDOL (ISOVUE-370) INJECTION 76% COMPARISON:  Radiographs earlier this day. FINDINGS: Cardiovascular: Examination Lewis positive for pulmonary embolus with large thromboembolic burden. Large nearly occlusive thrombus in the left main pulmonary artery extends into the upper and lower lobar branches, near complete occlusion of lingular branches, and rather extensive but  incomplete occlusion of lower lobe segmental branches. On the right filling defects in the distal main pulmonary artery extending into all lobar and left lower lobe subsegmental branches. There Lewis straightening of the intraventricular septum with right heart strain, RV to LV ratio of 2.45. thoracic aorta Lewis normal  in caliber with minimal atherosclerosis. Heart size upper normal. No pericardial effusion. Mediastinum/Nodes: No adenopathy. Heterogeneous prominent sized thyroid gland with bilateral calcifications consistent with goiter. The esophagus Lewis decompressed. Lungs/Pleura: No evidence pulmonary infarct or focal consolidation. Central bronchial thickening, likely chronic. No pleural effusion or pulmonary edema. No pulmonary mass. Upper Abdomen: Bariatric surgery partially included. Postcholecystectomy. No acute upper abdominal findings. Musculoskeletal: Degenerative change in the spine. There are no acute or suspicious osseous abnormalities. Review of the MIP images confirms the above findings. IMPRESSION: 1. Positive for acute PE with large clot burden and CT evidence of right heart strain (RV/LV Ratio = 2.45) consistent with at least submassive (intermediate risk) PE. The presence of right heart strain has been associated with an increased risk of morbidity and mortality. Please activate Code PE by paging [phone]. 2. Mild bronchial thickening which Lewis likely chronic. Critical Value/emergent results were called by telephone at the time of interpretation on 01/15/2018 at 11:09 pm to Dr. Sherwood Gambler , Audrey verbally acknowledged these results. Electronically Signed   By: Jeb Levering M.D.   On: 01/15/2018 23:10    Micro Results    Recent Results (from the past 240 hour(s))  MRSA PCR Screening     Status: None   Collection Time: 01/16/18  3:00 AM  Result Value Ref Range Status   MRSA by PCR NEGATIVE NEGATIVE Final    Comment:        The GeneXpert MRSA Assay (FDA approved for NASAL specimens only), Lewis one component of a comprehensive MRSA colonization surveillance program. It Lewis not intended to diagnose MRSA infection nor to guide or monitor treatment for MRSA infections. Performed at [facility], [place] [address] [place], [place] [address]   Culture, blood (Routine X 2) w Reflex to ID  Panel     Status: None (Preliminary result)   Collection Time: 01/16/18 10:48 AM  Result Value Ref Range Status   Specimen Description BLOOD BLOOD RIGHT HAND  Final   Special Requests   Final    BOTTLES DRAWN AEROBIC AND ANAEROBIC Blood Culture results may not be optimal due to an inadequate volume of blood received in culture bottles   Culture   Final    NO GROWTH 2 DAYS Performed at [facility], [place] [address] [place], [place] [address]    Report Status PENDING  Incomplete  Culture, blood (Routine X 2) w Reflex to ID Panel     Status: None (Preliminary result)   Collection Time: 01/16/18 11:00 AM  Result Value Ref Range Status   Specimen Description BLOOD BLOOD LEFT HAND  Final   Special Requests   Final    BOTTLES DRAWN AEROBIC AND ANAEROBIC Blood Culture results may not be optimal due to an inadequate volume of blood received in culture bottles   Culture   Final    NO GROWTH 2 DAYS Performed at [facility], [place] [address] [place], [place] [address]    Report Status PENDING  Incomplete       Today   Subjective    Arita Severtson today has no new complaints, no chest pains, no significant shortness of breath at rest, dyspnea on exertion  has improved significantly, no significant dizziness or palpitations, no bleeding concerns          Patient has been seen and examined prior to discharge   Objective   Blood pressure 105/80, pulse 88, temperature 98 F (36.7 C), temperature source Oral, resp. rate 20, height 5\' 4"  (1.626 m), weight 113.6 kg (250 lb 7.1 oz), last menstrual period 07/09/2008, SpO2 96 %.   Intake/Output Summary (Last 24 hours) at 01/18/2018 1746 Last data filed at 01/18/2018 1300 Gross per 24 hour  Intake 476 ml  Output 450 ml  Net 26 ml    Exam Gen:- Awake Alert, obese, in no apparent distress , able to speak in complete sentences Nose- [place] 2 L/min HEENT:- [place].AT, No sclera icterus Neck-Supple Neck,No JVD,.  Lungs-diminished in bases, no  wheezing CV- S1, S2 normal, regular Abd-  +ve B.Sounds, Abd Soft, No tenderness,    Extremity/Skin:-Good pulses, no significant edema Psych-affect Lewis appropriate, oriented x3 Neuro-no new focal deficits, no tremors   Data Review   CBC w Diff:  Lab Results  Component Value Date   WBC 11.0 (H) 01/18/2018   HGB 11.6 (L) 01/18/2018   HCT 33.9 (L) 01/18/2018   PLT 227 01/18/2018   LYMPHOPCT 32 08/18/2012   MONOPCT 5 08/18/2012   EOSPCT 1 08/18/2012   BASOPCT 0 08/18/2012    CMP:  Lab Results  Component Value Date   NA 136 01/17/2018   K 4.2 01/17/2018   CL 104 01/17/2018   CO2 23 01/17/2018   BUN 20 01/17/2018   CREATININE 1.21 (H) 01/17/2018   CREATININE 0.70 01/15/2014   PROT 7.2 07/26/2014   ALBUMIN 3.9 07/26/2014   BILITOT 0.6 07/26/2014   ALKPHOS 79 07/26/2014   AST 14 07/26/2014   ALT 6 07/26/2014  .   Total Discharge time Lewis about 33 minutes  Roxan Hockey M.D on 01/18/2018 at 5:46 PM   Go to [email or web address] - password [email or web address] for contact info  [facility] - Office  [phone]

## 2018-01-18 NOTE — Progress Notes (Signed)
   Use home oxygen continuously via nasal cannula at 2 L/min with gaseous portability and conserving device due to persistent hypoxia    Patient Saturations on Room Air at Rest = 88%  Patient Saturations on Room Air while Ambulating = 80%  Patient Saturations on 2 Liters of oxygen while Ambulating = 91%  Please briefly explain why patient needs home oxygen: Pt unable to maintain SpO2 saturations >90% while ambulating on room air.

## 2018-01-18 NOTE — Progress Notes (Signed)
Patient given all discharge paper work. All pages reviewed and subsequent questions answered. Work excuse note and perscriptions given and patients belongings returned.  Patients telemetry and peripheral IV's discontinued without complications. Patient placed in wheel chair and escorted by staff member to pick up location with home oxygen.   English as a second language teacher

## 2018-01-21 LAB — CULTURE, BLOOD (ROUTINE X 2)
Culture: NO GROWTH
Culture: NO GROWTH

## 2019-11-07 IMAGING — CT CT ANGIO CHEST
2 of 7 series · 18 of 36 positions shown · IV contrast (iopamidol)
Comparison: Radiographs earlier this day.

CLINICAL DATA: Shortness of breath.  Tachycardia and COPD.

EXAM:
CT ANGIOGRAPHY CHEST WITH CONTRAST
TECHNIQUE: Multidetector CT imaging of the chest was performed using the
standard protocol during bolus administration of intravenous
contrast. Multiplanar CT image reconstructions and MIPs were
obtained to evaluate the vascular anatomy.
CONTRAST:  80mL D6AEQN-1FM IOPAMIDOL (D6AEQN-1FM) INJECTION 76%

[Series 6: pe coronal mpr · coronal · 0.59mm/px · 1 of 101 slices shown]
[im 51/101  mediastinal]
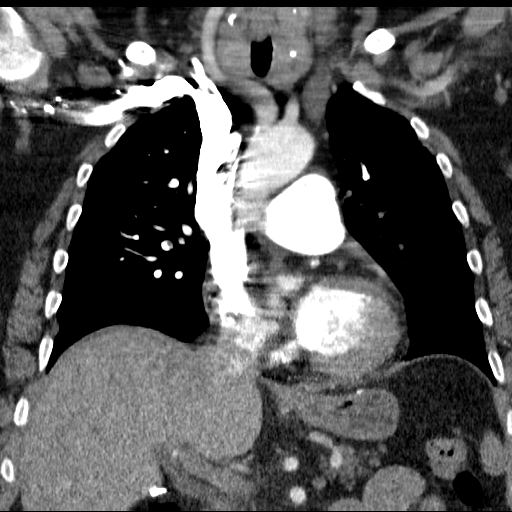

[Series 10: pe thins · axial · 0.72mm/px · z∈[-295,-30]mm · 17 of 391 slices shown]
[im 19/391  lung]
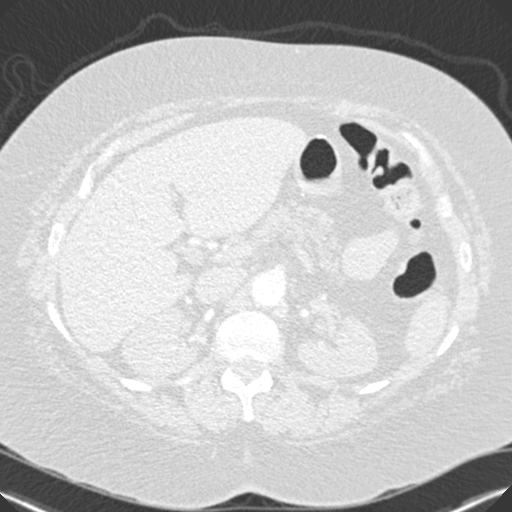
[im 38/391  mediastinal]
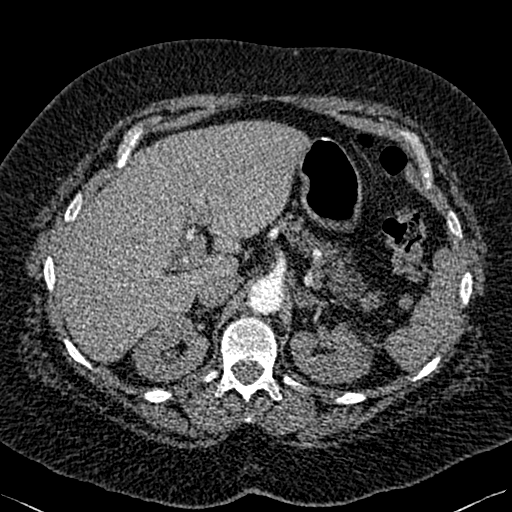
[im 56/391  lung]
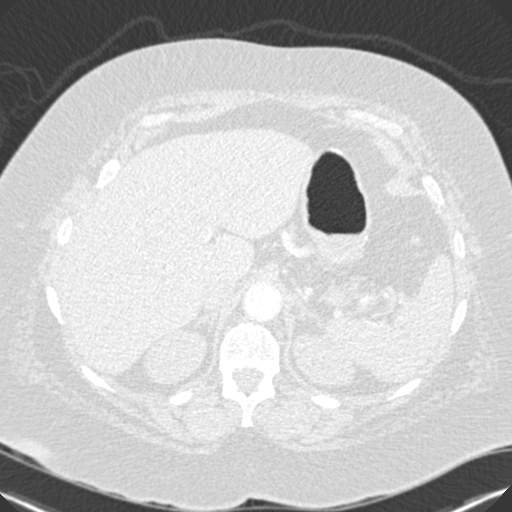
[im 93/391  mediastinal]
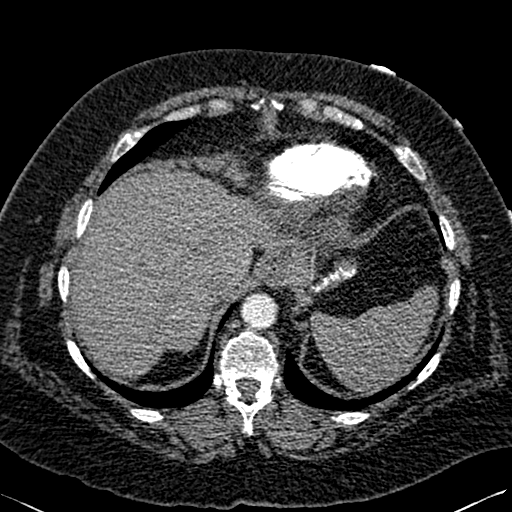
[im 112/391  lung]
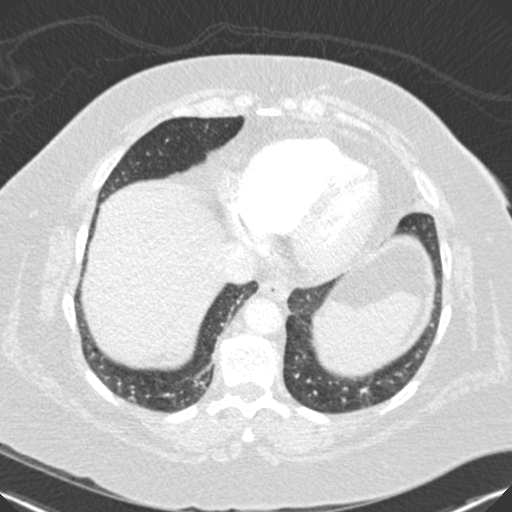
[im 131/391  mediastinal]
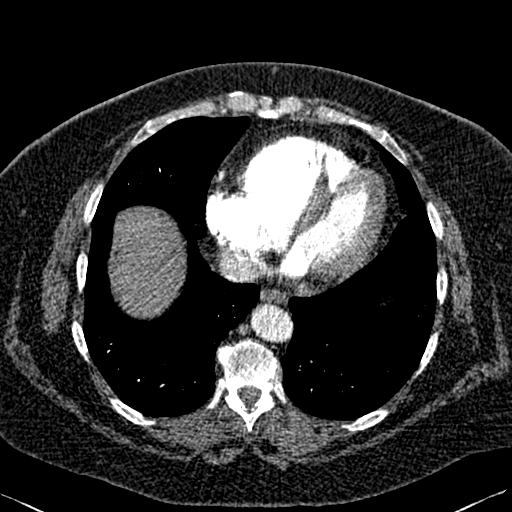
[im 149/391  lung]
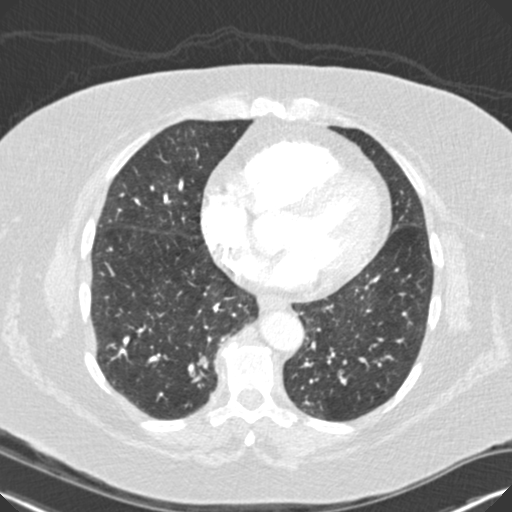
[im 168/391  mediastinal]
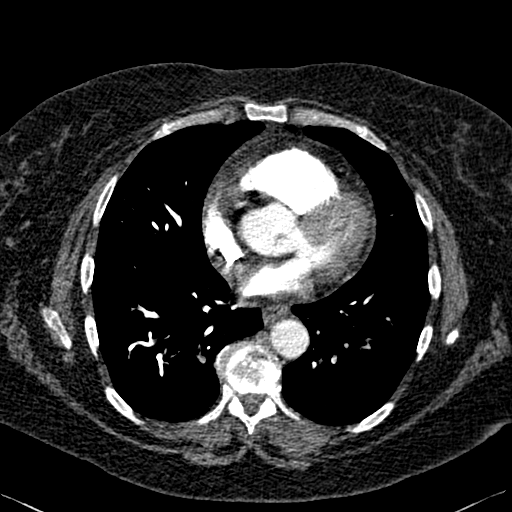
[im 205/391  lung]
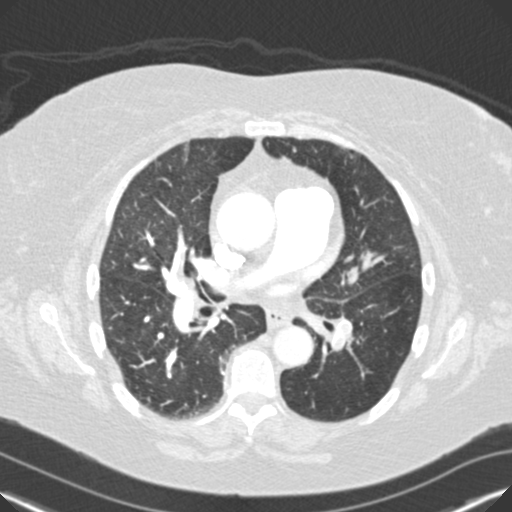
[im 223/391  mediastinal]
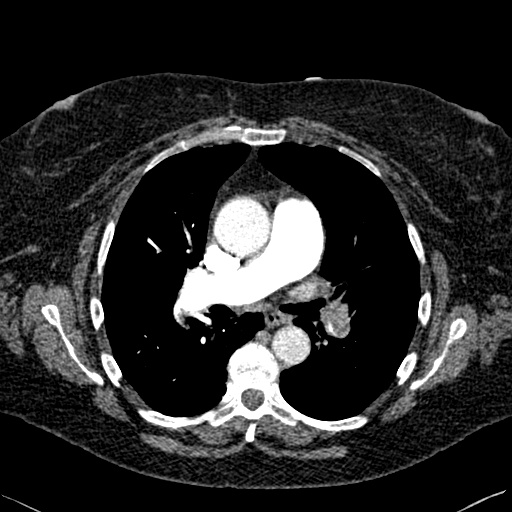
[im 242/391  lung]
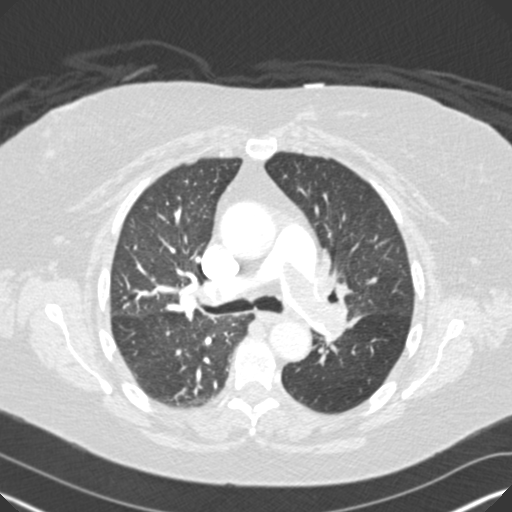
[im 261/391  mediastinal]
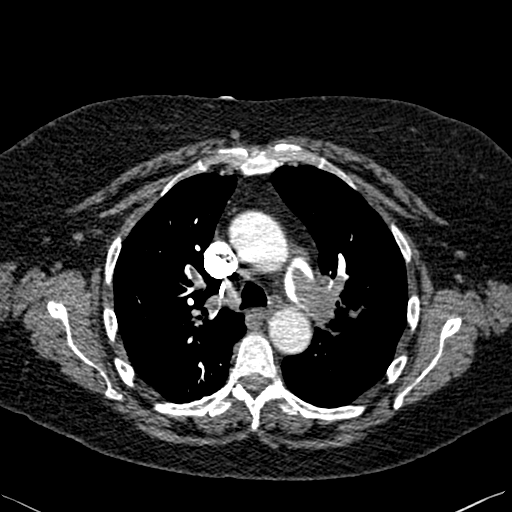
[im 279/391  lung]
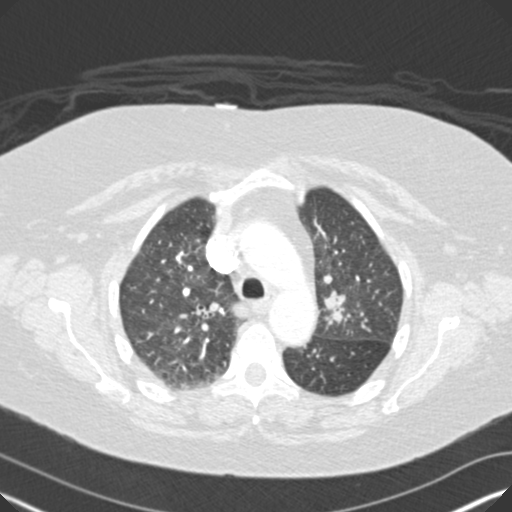
[im 298/391  mediastinal]
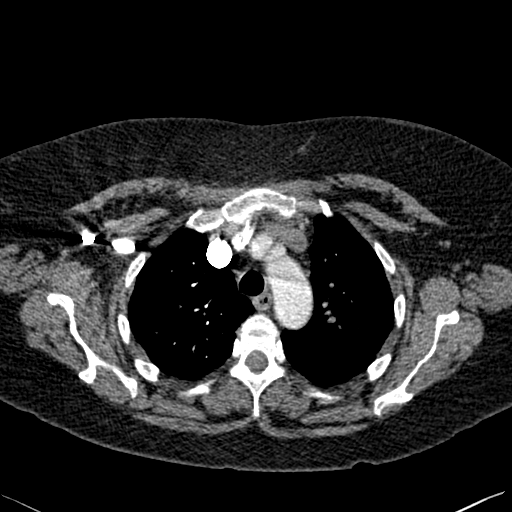
[im 335/391  lung]
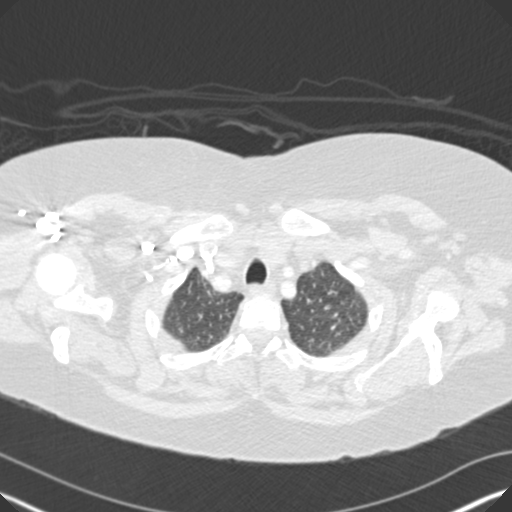
[im 353/391  mediastinal]
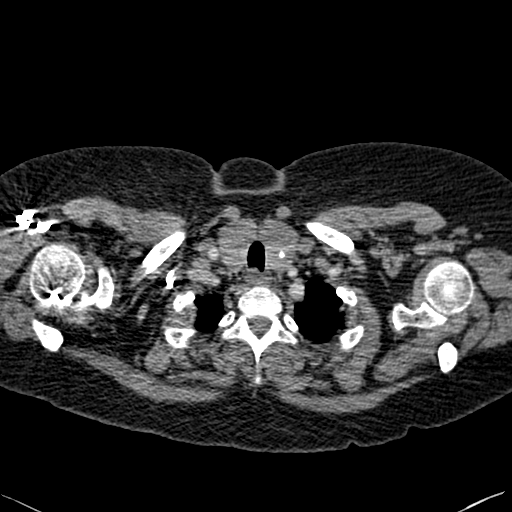
[im 372/391  lung]
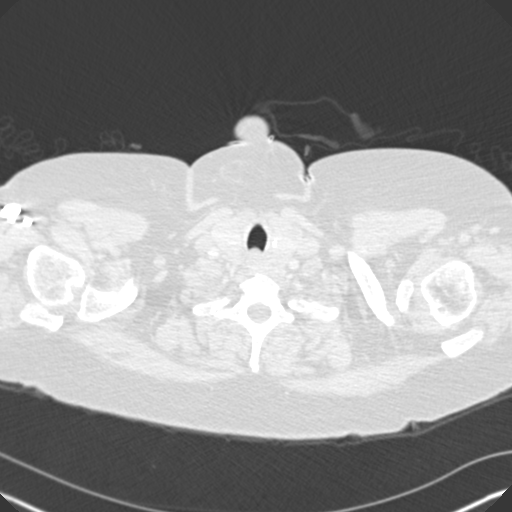

[18 of 36 positions shown; findings below may reference images not displayed]

FINDINGS: Cardiovascular: Examination is positive for pulmonary embolus with
large thromboembolic burden. Large nearly occlusive thrombus in the
left main pulmonary artery extends into the upper and lower lobar
branches, near complete occlusion of lingular branches, and rather
extensive but incomplete occlusion of lower lobe segmental branches.
On the right filling defects in the distal main pulmonary artery
extending into all lobar and left lower lobe subsegmental branches.
There is straightening of the intraventricular septum with right
heart strain, RV to LV ratio of 2.45. thoracic aorta is normal in
caliber with minimal atherosclerosis. Heart size upper normal. No
pericardial effusion.

Mediastinum/Nodes: No adenopathy. Heterogeneous prominent sized
thyroid gland with bilateral calcifications consistent with goiter.
The esophagus is decompressed.

Lungs/Pleura: No evidence pulmonary infarct or focal consolidation.
Central bronchial thickening, likely chronic. No pleural effusion or
pulmonary edema. No pulmonary mass.

Upper Abdomen: Bariatric surgery partially included.
Postcholecystectomy. No acute upper abdominal findings.

Musculoskeletal: Degenerative change in the spine. There are no
acute or suspicious osseous abnormalities.

Review of the MIP images confirms the above findings.
IMPRESSION: 1. Positive for acute PE with large clot burden and CT evidence of
right heart strain (RV/LV Ratio = 2.45) consistent with at least
submassive (intermediate risk) PE. The presence of right heart
strain has been associated with an increased risk of morbidity and
mortality. Please activate Code PE by paging [phone].
2. Mild bronchial thickening which is likely chronic.

Critical Value/emergent results were called by telephone at the time
of interpretation on 01/15/2018 at [DATE] to Dr. OMNEIA TAA ,
who verbally acknowledged these results.
